# Patient Record
Sex: Female | Born: 1980 | Race: White | Hispanic: No | Marital: Married | Smoking: Never smoker
Health system: Southern US, Community
[De-identification: ages and names within clinical notes are randomized; demographics above are authoritative.]

## PROBLEM LIST (undated history)

## (undated) DIAGNOSIS — R319 Hematuria, unspecified: Secondary | ICD-10-CM

## (undated) DIAGNOSIS — F32A Depression, unspecified: Secondary | ICD-10-CM

## (undated) DIAGNOSIS — F329 Major depressive disorder, single episode, unspecified: Secondary | ICD-10-CM

## (undated) DIAGNOSIS — R519 Headache, unspecified: Secondary | ICD-10-CM

## (undated) DIAGNOSIS — F419 Anxiety disorder, unspecified: Secondary | ICD-10-CM

## (undated) DIAGNOSIS — N201 Calculus of ureter: Secondary | ICD-10-CM

## (undated) DIAGNOSIS — R3915 Urgency of urination: Secondary | ICD-10-CM

## (undated) DIAGNOSIS — R300 Dysuria: Secondary | ICD-10-CM

## (undated) DIAGNOSIS — R350 Frequency of micturition: Secondary | ICD-10-CM

## (undated) DIAGNOSIS — Z87442 Personal history of urinary calculi: Secondary | ICD-10-CM

## (undated) DIAGNOSIS — F909 Attention-deficit hyperactivity disorder, unspecified type: Secondary | ICD-10-CM

## (undated) DIAGNOSIS — R51 Headache: Secondary | ICD-10-CM

## (undated) HISTORY — DX: Anxiety disorder, unspecified: F41.9

---

## 2001-07-11 ENCOUNTER — Other Ambulatory Visit: Admission: RE | Admit: 2001-07-11 | Discharge: 2001-07-11 | Payer: Self-pay | Admitting: Gynecology

## 2002-02-26 ENCOUNTER — Inpatient Hospital Stay (HOSPITAL_COMMUNITY): Admission: AD | Admit: 2002-02-26 | Discharge: 2002-02-26 | Payer: Self-pay | Admitting: *Deleted

## 2002-07-03 ENCOUNTER — Encounter: Payer: Self-pay | Admitting: Obstetrics and Gynecology

## 2002-07-03 ENCOUNTER — Ambulatory Visit (HOSPITAL_COMMUNITY): Admission: RE | Admit: 2002-07-03 | Discharge: 2002-07-03 | Payer: Self-pay | Admitting: Obstetrics and Gynecology

## 2002-07-31 ENCOUNTER — Ambulatory Visit (HOSPITAL_COMMUNITY): Admission: RE | Admit: 2002-07-31 | Discharge: 2002-07-31 | Payer: Self-pay | Admitting: Obstetrics and Gynecology

## 2002-07-31 ENCOUNTER — Encounter: Payer: Self-pay | Admitting: Obstetrics and Gynecology

## 2002-11-25 ENCOUNTER — Inpatient Hospital Stay (HOSPITAL_COMMUNITY): Admission: AD | Admit: 2002-11-25 | Discharge: 2002-11-27 | Payer: Self-pay | Admitting: Obstetrics and Gynecology

## 2003-10-02 ENCOUNTER — Emergency Department (HOSPITAL_COMMUNITY): Admission: EM | Admit: 2003-10-02 | Discharge: 2003-10-02 | Payer: Self-pay | Admitting: Emergency Medicine

## 2004-03-16 ENCOUNTER — Emergency Department (HOSPITAL_COMMUNITY): Admission: EM | Admit: 2004-03-16 | Discharge: 2004-03-16 | Payer: Self-pay | Admitting: Emergency Medicine

## 2004-03-25 ENCOUNTER — Encounter: Admission: RE | Admit: 2004-03-25 | Discharge: 2004-03-25 | Payer: Self-pay | Admitting: Neurosurgery

## 2004-05-30 ENCOUNTER — Other Ambulatory Visit: Admission: RE | Admit: 2004-05-30 | Discharge: 2004-05-30 | Payer: Self-pay | Admitting: Gynecology

## 2004-11-07 ENCOUNTER — Ambulatory Visit (HOSPITAL_COMMUNITY): Admission: RE | Admit: 2004-11-07 | Discharge: 2004-11-07 | Payer: Self-pay | Admitting: *Deleted

## 2004-11-07 ENCOUNTER — Ambulatory Visit (HOSPITAL_BASED_OUTPATIENT_CLINIC_OR_DEPARTMENT_OTHER): Admission: RE | Admit: 2004-11-07 | Discharge: 2004-11-07 | Payer: Self-pay | Admitting: *Deleted

## 2004-11-07 HISTORY — PX: OTHER SURGICAL HISTORY: SHX169

## 2005-07-20 ENCOUNTER — Other Ambulatory Visit: Admission: RE | Admit: 2005-07-20 | Discharge: 2005-07-20 | Payer: Self-pay | Admitting: Gynecology

## 2006-08-26 ENCOUNTER — Other Ambulatory Visit: Admission: RE | Admit: 2006-08-26 | Discharge: 2006-08-26 | Payer: Self-pay | Admitting: Gynecology

## 2009-02-09 ENCOUNTER — Emergency Department (HOSPITAL_COMMUNITY): Admission: EM | Admit: 2009-02-09 | Discharge: 2009-02-09 | Payer: Self-pay | Admitting: Emergency Medicine

## 2009-02-09 ENCOUNTER — Ambulatory Visit: Payer: Self-pay | Admitting: Surgery

## 2009-02-09 ENCOUNTER — Encounter (INDEPENDENT_AMBULATORY_CARE_PROVIDER_SITE_OTHER): Payer: Self-pay | Admitting: Emergency Medicine

## 2009-05-11 ENCOUNTER — Emergency Department (HOSPITAL_COMMUNITY): Admission: EM | Admit: 2009-05-11 | Discharge: 2009-05-11 | Payer: Self-pay | Admitting: Emergency Medicine

## 2010-01-19 ENCOUNTER — Emergency Department (HOSPITAL_COMMUNITY): Admission: EM | Admit: 2010-01-19 | Discharge: 2010-01-19 | Payer: Self-pay | Admitting: Emergency Medicine

## 2010-01-25 ENCOUNTER — Emergency Department (HOSPITAL_COMMUNITY): Admission: EM | Admit: 2010-01-25 | Discharge: 2010-01-25 | Payer: Self-pay | Admitting: Family Medicine

## 2010-05-27 ENCOUNTER — Encounter (HOSPITAL_COMMUNITY): Payer: Self-pay | Admitting: Radiology

## 2010-05-27 ENCOUNTER — Inpatient Hospital Stay (HOSPITAL_COMMUNITY)
Admission: AD | Admit: 2010-05-27 | Discharge: 2010-05-27 | Disposition: A | Discharge status: Home / Self Care | Payer: Medicaid Other | Source: Ambulatory Visit | Attending: Obstetrics and Gynecology | Admitting: Obstetrics and Gynecology

## 2010-05-27 ENCOUNTER — Inpatient Hospital Stay (HOSPITAL_COMMUNITY): Admit: 2010-05-27 | Payer: Self-pay | Admitting: Obstetrics and Gynecology

## 2010-05-27 ENCOUNTER — Inpatient Hospital Stay (HOSPITAL_COMMUNITY): Payer: Medicaid Other

## 2010-05-27 DIAGNOSIS — R109 Unspecified abdominal pain: Secondary | ICD-10-CM

## 2010-05-27 DIAGNOSIS — R319 Hematuria, unspecified: Secondary | ICD-10-CM

## 2010-05-27 DIAGNOSIS — O99891 Other specified diseases and conditions complicating pregnancy: Secondary | ICD-10-CM

## 2010-05-27 DIAGNOSIS — O9989 Other specified diseases and conditions complicating pregnancy, childbirth and the puerperium: Secondary | ICD-10-CM

## 2010-05-27 LAB — URINALYSIS, ROUTINE W REFLEX MICROSCOPIC
Bilirubin Urine: NEGATIVE
Ketones, ur: 15 mg/dL — AB
Nitrite: NEGATIVE
Protein, ur: NEGATIVE mg/dL
Specific Gravity, Urine: >1.03 — ABNORMAL HIGH (ref 1.005–1.030)
Urine Glucose, Fasting: NEGATIVE mg/dL
Urobilinogen, UA: 0.2 mg/dL (ref 0.0–1.0)
pH: 6 (ref 5.0–8.0)

## 2010-05-27 LAB — URINE MICROSCOPIC-ADD ON

## 2010-05-29 LAB — URINE CULTURE
Colony Count: NO GROWTH
Culture: NO GROWTH

## 2010-08-26 ENCOUNTER — Other Ambulatory Visit (HOSPITAL_COMMUNITY): Payer: Self-pay | Admitting: Obstetrics and Gynecology

## 2010-08-26 DIAGNOSIS — O3660X0 Maternal care for excessive fetal growth, unspecified trimester, not applicable or unspecified: Secondary | ICD-10-CM

## 2010-08-28 ENCOUNTER — Ambulatory Visit (HOSPITAL_COMMUNITY)
Admission: RE | Admit: 2010-08-28 | Discharge: 2010-08-28 | Disposition: A | Discharge status: Home / Self Care | Payer: Medicaid Other | Source: Ambulatory Visit | Attending: Obstetrics and Gynecology | Admitting: Obstetrics and Gynecology

## 2010-08-28 DIAGNOSIS — Z1389 Encounter for screening for other disorder: Secondary | ICD-10-CM | POA: Insufficient documentation

## 2010-08-28 DIAGNOSIS — O3660X0 Maternal care for excessive fetal growth, unspecified trimester, not applicable or unspecified: Secondary | ICD-10-CM

## 2010-08-28 DIAGNOSIS — Z363 Encounter for antenatal screening for malformations: Secondary | ICD-10-CM | POA: Insufficient documentation

## 2010-08-29 NOTE — Discharge Summary (Signed)
NAME:  Julie Mills, Julie Mills                       ACCOUNT NO.:  [id number]   MEDICAL RECORD NO.:  [id number]                   PATIENT TYPE:  INP   LOCATION:  9142                                 FACILITY:  [facility]   PHYSICIAN:  Zenaida Niece, M.D.             DATE OF BIRTH:  [birth year]   DATE OF ADMISSION:  11/25/2002  DATE OF DISCHARGE:  11/27/2002                                 DISCHARGE SUMMARY   ADMISSION DIAGNOSES:  1. Intrauterine pregnancy at 38 weeks.  2. Group B Strep carrier.   DISCHARGE DIAGNOSES:  1. Intrauterine pregnancy at 38 weeks.  2. Group B Strep carrier.   PROCEDURE:  On November 25, 2002, she had a spontaneous vaginal delivery and  repair of a fourth degree laceration.   HISTORY OF PRESENT ILLNESS:  This is a 30 year old white female gravida 2,  para 0-0-1-0 with an EGA of 38+ weeks, who presented with a complaint of  ruptured membranes at 0900 on the day of admission.  Initial evaluation in  triage revealed her to be ruptured and 5 cm dilated.  Prenatal care  complicated only by group B Strep positive.   PRENATAL LABORATORY DATA:  O positive with a negative antibody screen.  RPR  nonreactive.  Rubella immune.  Hepatitis B surface antigen negative.  Gonorrhea and Chlamydia negative.  Triple screen normal.  One hour Glucola  113 and group B Strep is positive.   PAST MEDICAL HISTORY:  Mild asthma.   CURRENT MEDICATIONS:  Albuterol inhaler as needed.   PHYSICAL EXAMINATION:  VITAL SIGNS:  She is afebrile with stable vital  signs.  Fetal heart tracing reactive with variable decelerations.  ABDOMEN:  Gravid, nontender.  VAGINAL:  On Dr. Berenda Morale first examination, she is complete, complete  and +3.   HOSPITAL COURSE:  The patient was admitted and progressed on her own and  received one dose of antibiotics for group B Strep positive.  She had a  vaginal delivery of a viable female infant with Apgars of 9 and 9 that weighed  7 pounds 14 ounces. Placenta  delivered spontaneous and was intact.  There  was a nuchal cord x1 which was reduced.  She had a fourth degree laceration  repaired in standard fashion.  Estimated blood loss was 400 mL.   Postpartum, the patient did well without complications and breast fed her  baby without problems.  Predelivery hemoglobin was 14.7, post delivery was  12.3.   On postpartum day #2, she is stable for discharge home.   DISCHARGE INSTRUCTIONS:  Regular diet.  Pelvic rest.  Follow-up in six  weeks.   DISCHARGE MEDICATIONS:  1. Percocet #20 one to two p.o. q.4-6h. p.r.n. pain.  2. Over-the-counter Motrin.  3. Over-the-counter stool softeners.   She is also given our discharge pamphlet.  Zenaida Niece, M.D.    TDM/MEDQ  D:  11/27/2002  T:  11/27/2002  Job:  [id number]
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
## 2010-09-15 ENCOUNTER — Inpatient Hospital Stay (HOSPITAL_COMMUNITY)
Admission: AD | Admit: 2010-09-15 | Discharge: 2010-09-17 | DRG: 775 | Disposition: A | Discharge status: Home / Self Care | Payer: Medicaid Other | Source: Ambulatory Visit | Attending: Obstetrics and Gynecology | Admitting: Obstetrics and Gynecology

## 2010-09-15 LAB — RPR: RPR Ser Ql: NONREACTIVE

## 2010-09-15 LAB — CBC
HCT: 38 % (ref 36.0–46.0)
Hemoglobin: 13.5 g/dL (ref 12.0–15.0)
MCH: 32.7 pg (ref 26.0–34.0)
MCHC: 35.5 g/dL (ref 30.0–36.0)
MCV: 92 fL (ref 78.0–100.0)
RBC: 4.13 MIL/uL (ref 3.87–5.11)
RDW: 13.2 % (ref 11.5–15.5)
WBC: 8.9 10*3/uL (ref 4.0–10.5)

## 2010-09-16 LAB — CBC
HCT: 31.4 % — ABNORMAL LOW (ref 36.0–46.0)
Hemoglobin: 10.4 g/dL — ABNORMAL LOW (ref 12.0–15.0)
MCH: 31 pg (ref 26.0–34.0)
MCHC: 33.1 g/dL (ref 30.0–36.0)
MCV: 93.7 fL (ref 78.0–100.0)
Platelets: 162 10*3/uL (ref 150–400)
RBC: 3.35 MIL/uL — ABNORMAL LOW (ref 3.87–5.11)
RDW: 13.5 % (ref 11.5–15.5)
WBC: 8.7 10*3/uL (ref 4.0–10.5)

## 2010-09-16 LAB — CCBB MATERNAL DONOR DRAW

## 2010-09-19 ENCOUNTER — Inpatient Hospital Stay (HOSPITAL_COMMUNITY)
Admission: AD | Admit: 2010-09-19 | Payer: Medicaid Other | Source: Ambulatory Visit | Admitting: Obstetrics and Gynecology

## 2010-09-19 NOTE — Discharge Summary (Signed)
  Julie Mills, BURD NO.:  [id number]  MEDICAL RECORD NO.:  [id number]  LOCATION:  9111                          FACILITY:  [facility]  PHYSICIAN:  Huel Cote, M.D. DATE OF BIRTH:  1980-08-04  DATE OF ADMISSION:  09/15/2010 DATE OF DISCHARGE:  09/17/2010                              DISCHARGE SUMMARY   DISCHARGE DIAGNOSES: 1. Term pregnancy at 39 plus weeks delivered. 2. Status post normal spontaneous vaginal delivery.  DISCHARGE MEDICATIONS: 1. Motrin 600 mg p.o. every 6 hours. 2. Percocet 1-2 tablets p.o. every 4 hours p.r.n.  DISCHARGE FOLLOWUP:  The patient to follow up in 6 weeks for her full postpartum exam.  HOSPITAL COURSE:  The patient is a 30 year old G3, P1-0-1-1 who was admitted at 8 plus weeks' gestation with spontaneous onset of regular contractions and cervical change to 3-4 cm.  She was admitted and continued to progress on her own.  Her prenatal care was relatively uneventful except for size greater than dates with ultrasound performed approximately 1 month prior to admission showing baby at the 75th percentile.  Prenatal labs are as follows:  O+, rubella immune, hepatitis B surface antigen negative, first trimester screen normal, 1- hour Glucola was 162, 3-hour Glucola was normal, group B strep was negative.  PAST OBSTETRICAL HISTORY:  She had a vaginal delivery at 38 weeks of a 7 pounds 14 ounces infant in 2004.  PAST MEDICAL HISTORY:  Significant for asthma, migraines, and depression, although she is on no medications currently.  PAST SURGICAL HISTORY:  Breast augmentation.  ALLERGIES:  There were no known drug allergies.  MEDICATIONS:  None.  On admission, she was afebrile with stable vital signs.  Fetal heart rate was reactive.  After laboring for several hours she progressed on her own to complete dilation and had rupture of membranes performed shortly before that with moderate meconium noted.  She then progressed to  complete and pushed well with a normal spontaneous vaginal delivery of a viable female infant over small second-degree laceration.  Apgars were 08/09, weight was 9 pounds 4 ounces.  Placenta delivered spontaneously.  A second-degree laceration was repaired with 3-0 Vicryl Rapide.  There was a right labial a periurethral laceration which was also repaired for hemostasis with 3-0 Vicryl as well.  Estimated blood loss 400 mL.  She was then admitted for routine postpartum care.  On postpartum day #1, she was doing quite well.  Hemoglobin was 10.4.  Pain was well-controlled with p.o. medications.  On postpartum day #2, she was given her prescriptions for pain meds and advised on pelvic rest and will make her appointment for her 6-week checkup.  She was then discharged to home.     Huel Cote, M.D.     KR/MEDQ  D:  09/17/2010  T:  09/18/2010  Job:  [id number]  Electronically Signed by Huel Cote M.D. on 09/19/2010 11:00:07 PM

## 2010-10-16 ENCOUNTER — Emergency Department (HOSPITAL_COMMUNITY): Payer: Medicaid Other

## 2010-10-16 ENCOUNTER — Emergency Department (HOSPITAL_COMMUNITY)
Admission: EM | Admit: 2010-10-16 | Discharge: 2010-10-17 | Disposition: A | Discharge status: Home / Self Care | Payer: Medicaid Other | Attending: Emergency Medicine | Admitting: Emergency Medicine

## 2010-10-16 DIAGNOSIS — R109 Unspecified abdominal pain: Secondary | ICD-10-CM | POA: Insufficient documentation

## 2010-10-16 DIAGNOSIS — Z79899 Other long term (current) drug therapy: Secondary | ICD-10-CM | POA: Insufficient documentation

## 2010-10-16 DIAGNOSIS — R300 Dysuria: Secondary | ICD-10-CM | POA: Insufficient documentation

## 2010-10-16 DIAGNOSIS — N200 Calculus of kidney: Secondary | ICD-10-CM | POA: Insufficient documentation

## 2010-10-16 DIAGNOSIS — N390 Urinary tract infection, site not specified: Secondary | ICD-10-CM | POA: Insufficient documentation

## 2010-10-16 LAB — URINALYSIS, ROUTINE W REFLEX MICROSCOPIC
Glucose, UA: NEGATIVE mg/dL
Ketones, ur: 15 mg/dL — AB
Nitrite: POSITIVE — AB
Protein, ur: 30 mg/dL — AB
Specific Gravity, Urine: 1.022 (ref 1.005–1.030)
Urobilinogen, UA: 1 mg/dL (ref 0.0–1.0)

## 2010-10-16 LAB — CBC
HCT: 37.6 % (ref 36.0–46.0)
Hemoglobin: 13 g/dL (ref 12.0–15.0)
MCH: 31.2 pg (ref 26.0–34.0)
MCHC: 34.6 g/dL (ref 30.0–36.0)
MCV: 90.2 fL (ref 78.0–100.0)
Platelets: 203 10*3/uL (ref 150–400)
RBC: 4.17 MIL/uL (ref 3.87–5.11)
RDW: 12 % (ref 11.5–15.5)
WBC: 7.7 10*3/uL (ref 4.0–10.5)

## 2010-10-16 LAB — URINE MICROSCOPIC-ADD ON

## 2010-10-16 LAB — POCT I-STAT, CHEM 8
BUN: 20 mg/dL (ref 6–23)
Calcium, Ion: 1.2 mmol/L (ref 1.12–1.32)
Chloride: 107 mEq/L (ref 96–112)
Creatinine, Ser: 1 mg/dL (ref 0.50–1.10)
Glucose, Bld: 130 mg/dL — ABNORMAL HIGH (ref 70–99)
HCT: 39 % (ref 36.0–46.0)
Hemoglobin: 13.3 g/dL (ref 12.0–15.0)
Sodium: 141 mEq/L (ref 135–145)

## 2010-10-16 LAB — DIFFERENTIAL
Basophils Absolute: 0 10*3/uL (ref 0.0–0.1)
Eosinophils Absolute: 0.1 10*3/uL (ref 0.0–0.7)
Eosinophils Relative: 2 % (ref 0–5)
Lymphs Abs: 1.8 10*3/uL (ref 0.7–4.0)
Monocytes Absolute: 0.5 10*3/uL (ref 0.1–1.0)
Monocytes Relative: 6 % (ref 3–12)

## 2010-10-16 LAB — POCT PREGNANCY, URINE: Preg Test, Ur: NEGATIVE

## 2010-10-18 LAB — URINE CULTURE
Colony Count: 50000
Culture  Setup Time: 201207060653

## 2013-03-03 ENCOUNTER — Ambulatory Visit: Payer: Self-pay

## 2013-11-03 ENCOUNTER — Encounter (HOSPITAL_COMMUNITY): Payer: Self-pay | Admitting: Emergency Medicine

## 2013-11-03 ENCOUNTER — Emergency Department (INDEPENDENT_AMBULATORY_CARE_PROVIDER_SITE_OTHER)
Admission: EM | Admit: 2013-11-03 | Discharge: 2013-11-03 | Disposition: A | Discharge status: Home / Self Care | Payer: Self-pay | Source: Home / Self Care | Attending: Family Medicine | Admitting: Family Medicine

## 2013-11-03 DIAGNOSIS — M79609 Pain in unspecified limb: Secondary | ICD-10-CM

## 2013-11-03 DIAGNOSIS — M79604 Pain in right leg: Secondary | ICD-10-CM

## 2013-11-03 MED ORDER — METHYLPREDNISOLONE 4 MG PO KIT: PACK | ORAL | Status: DC

## 2013-11-03 NOTE — ED Notes (Signed)
C/o right leg pain since Sunday.  States pain is an achy sensation in the entire leg.  Denies any known injury.   Also states had episodes of nausea and dizziness since pain started in leg.  No relief with otc meds.

## 2013-11-03 NOTE — ED Provider Notes (Signed)
CSN: [id number]     Arrival date & time 11/03/13  1143 History   First MD Initiated Contact with Patient 11/03/13 1226     Chief Complaint  Patient presents with  . Leg Pain   (Consider location/radiation/quality/duration/timing/severity/associated sxs/prior Treatment) Patient is a 33 y.o. female presenting with leg pain. The history is provided by the patient.  Leg Pain Location:  Leg Time since incident:  5 days Injury: no   Leg location:  R lower leg and R upper leg Pain details:    Quality:  Aching   Radiates to:  Does not radiate   Severity:  Moderate   Onset quality:  Sudden   Progression:  Unchanged Chronicity:  New Dislocation: no   Foreign body present:  No foreign bodies Prior injury to area:  No Relieved by:  Nothing Associated symptoms: no back pain, no decreased ROM, no fever, no numbness, no stiffness and no swelling   Risk factors: no frequent fractures and no known bone disorder   Risk factors comment:  Remote h/o dvt in leg., NKI, no new activity or shoes or sports.   Past Medical History  Diagnosis Date  . Anxiety    History reviewed. No pertinent past surgical history. Family History  Problem Relation Age of Onset  . Hyperlipidemia Father   . Cancer Maternal Grandmother   . Diabetes Maternal Grandmother   . Heart disease Maternal Grandmother   . Cancer Maternal Grandfather   . Diabetes Maternal Grandfather   . Heart disease Maternal Grandfather   . Stroke Maternal Grandfather    History  Substance Use Topics  . Smoking status: Never Smoker   . Smokeless tobacco: Not on file  . Alcohol Use: No   OB History   Grav Para Term Preterm Abortions TAB SAB Ect Mult Living   1              Review of Systems  Constitutional: Negative.  Negative for fever.  Musculoskeletal: Negative for back pain, gait problem, joint swelling and stiffness.  Skin: Negative.     Allergies  Review of patient's allergies indicates no known allergies.  Home  Medications   Prior to Admission medications   Medication Sig Start Date End Date Taking? Authorizing Provider  buPROPion (WELLBUTRIN SR) 150 MG 12 hr tablet Take 150 mg by mouth daily.   Yes Historical Provider, MD  FLUoxetine HCl (PROZAC PO) Take by mouth.   Yes Historical Provider, MD  levonorgestrel-ethinyl estradiol (SEASONALE,INTROVALE,JOLESSA) 0.15-0.03 MG tablet Take 1 tablet by mouth daily.   Yes Historical Provider, MD  methylPREDNISolone (MEDROL DOSEPAK) 4 MG tablet follow package directions, start today, take until finished. 11/03/13   Linna HoffJames D Mada Sadik, MD   BP 125/84  Pulse 80  Temp(Src) 98.1 F (36.7 C) (Oral)  Resp 12  SpO2 100%  LMP 10/04/2013 Physical Exam  Nursing note and vitals reviewed. Constitutional: She is oriented to person, place, and time. She appears well-developed and well-nourished.  Musculoskeletal: She exhibits tenderness.  Neg slr, reflexes nl, pulses 2+, no back pain , full hip rom, no edema or leg erythema. Ambulatory.  Neurological: She is alert and oriented to person, place, and time.  Skin: Skin is warm and dry.    ED Course  Procedures (including critical care time) Labs Review Labs Reviewed - No data to display  Imaging Review No results found.   MDM   1. Lower extremity pain, diffuse, right        Linna HoffJames D Kalene Cutler, MD  11/03/13 1334 

## 2013-11-03 NOTE — Discharge Instructions (Signed)
As discussed, see orthopedist if problem continues.

## 2013-11-03 NOTE — ED Notes (Signed)
Per pt request Rx for Medrol DosePak 4mg   Called to [facility] [address] [place].   Mw,cma

## 2014-02-12 ENCOUNTER — Encounter (HOSPITAL_COMMUNITY): Payer: Self-pay | Admitting: Emergency Medicine

## 2014-04-13 HISTORY — PX: BREAST ENHANCEMENT SURGERY: SHX7

## 2015-09-12 DIAGNOSIS — D509 Iron deficiency anemia, unspecified: Secondary | ICD-10-CM | POA: Diagnosis not present

## 2015-11-14 DIAGNOSIS — F900 Attention-deficit hyperactivity disorder, predominantly inattentive type: Secondary | ICD-10-CM | POA: Diagnosis not present

## 2015-11-14 DIAGNOSIS — F422 Mixed obsessional thoughts and acts: Secondary | ICD-10-CM | POA: Diagnosis not present

## 2016-01-26 ENCOUNTER — Encounter (HOSPITAL_COMMUNITY): Payer: Self-pay | Admitting: Family Medicine

## 2016-01-26 ENCOUNTER — Ambulatory Visit (HOSPITAL_COMMUNITY)
Admission: EM | Admit: 2016-01-26 | Discharge: 2016-01-26 | Disposition: A | Discharge status: Home / Self Care | Payer: BLUE CROSS/BLUE SHIELD | Attending: Internal Medicine | Admitting: Internal Medicine

## 2016-01-26 DIAGNOSIS — R519 Headache, unspecified: Secondary | ICD-10-CM

## 2016-01-26 DIAGNOSIS — R51 Headache: Secondary | ICD-10-CM

## 2016-01-26 NOTE — ED Triage Notes (Signed)
Pt here for left sided HA and throbbing. sts x 2 weeks and also neck pain and popping.

## 2016-01-26 NOTE — Discharge Instructions (Signed)
You should see your doctor tomorrow. If you need further tests your doctor will be able to arrange those tests for you.  Continue symptomatic treatment at home.

## 2016-01-27 DIAGNOSIS — Z3041 Encounter for surveillance of contraceptive pills: Secondary | ICD-10-CM | POA: Diagnosis not present

## 2016-01-27 DIAGNOSIS — Z681 Body mass index (BMI) 19 or less, adult: Secondary | ICD-10-CM | POA: Diagnosis not present

## 2016-01-27 DIAGNOSIS — Z1389 Encounter for screening for other disorder: Secondary | ICD-10-CM | POA: Diagnosis not present

## 2016-01-27 DIAGNOSIS — Z01419 Encounter for gynecological examination (general) (routine) without abnormal findings: Secondary | ICD-10-CM | POA: Diagnosis not present

## 2016-01-27 NOTE — ED Provider Notes (Signed)
CSN: [id number]     Arrival date & time 01/26/16  1549 History   First MD Initiated Contact with Patient 01/26/16 1735     Chief Complaint  Patient presents with  . Headache  . Neck Pain   (Consider location/radiation/quality/duration/timing/severity/associated sxs/prior Treatment) HPI Pt states that she has had a headache tha is the worse she has ever had for the last 2 weeks. Feeling as if something is popping in her neck almost causes her to pass out. No relief with home treatment. No hx of migraines some nausea and blurred vision.  Past Medical History:  Diagnosis Date  . Anxiety    History reviewed. No pertinent surgical history. Family History  Problem Relation Age of Onset  . Hyperlipidemia Father   . Cancer Maternal Grandmother   . Diabetes Maternal Grandmother   . Heart disease Maternal Grandmother   . Cancer Maternal Grandfather   . Diabetes Maternal Grandfather   . Heart disease Maternal Grandfather   . Stroke Maternal Grandfather    Social History  Substance Use Topics  . Smoking status: Never Smoker  . Smokeless tobacco: Never Used  . Alcohol use No   OB History    Gravida Para Term Preterm AB Living   1             SAB TAB Ectopic Multiple Live Births                 Review of Systems  Denies:   ABDOMINAL PAIN, CHEST PAIN, CONGESTION, DYSURIA, SHORTNESS OF BREATH  Allergies  Review of patient's allergies indicates no known allergies.  Home Medications   Prior to Admission medications   Medication Sig Start Date End Date Taking? Authorizing Provider  buPROPion (WELLBUTRIN SR) 150 MG 12 hr tablet Take 150 mg by mouth daily.    Historical Provider, MD  FLUoxetine HCl (PROZAC PO) Take by mouth.    Historical Provider, MD  levonorgestrel-ethinyl estradiol (SEASONALE,INTROVALE,JOLESSA) 0.15-0.03 MG tablet Take 1 tablet by mouth daily.    Historical Provider, MD  methylPREDNISolone (MEDROL DOSEPAK) 4 MG tablet follow package directions, start today, take  until finished. 11/03/13   Linna Hoff, MD   Meds Ordered and Administered this Visit  Medications - No data to display  BP (!) 135/101   Pulse 85   Temp 98.6 F (37 C)   Resp 18   SpO2 100%  No data found.   Physical Exam NURSES NOTES AND VITAL SIGNS REVIEWED. CONSTITUTIONAL: Well developed, well nourished, no acute distress HEENT: normocephalic, atraumatic EYES: Conjunctiva normal NECK:normal ROM, supple, no adenopathy PULMONARY:No respiratory distress, normal effort ABDOMINAL: Soft, ND, NT BS+, No CVAT MUSCULOSKELETAL: Normal ROM of all extremities,  SKIN: warm and dry without rash PSYCHIATRIC: Mood and affect, behavior are normal NEUROLOGICAL SCREENING EXAM: Constitutional:  oriented to person, place, and time.  Neurological:  .  normal strength and normal reflexes. No cranial nerve deficit or sensory deficit. negative Romberg sign. GCS eye subscore is 4. GCS verbal subscore is 5. GCS motor subscore is 6.    Urgent Care Course   Clinical Course    Procedures (including critical care time)  Labs Review Labs Reviewed - No data to display  Imaging Review No results found.   Visual Acuity Review  Right Eye Distance:   Left Eye Distance:   Bilateral Distance:    Right Eye Near:   Left Eye Near:    Bilateral Near:         MDM  1. Nonintractable headache, unspecified chronicity pattern, unspecified headache type    Pt is advised to go to ER or see her PCP for CT scan and further workup.    Tharon AquasFrank C Patrick, PA 01/27/16 1422

## 2016-03-30 DIAGNOSIS — F900 Attention-deficit hyperactivity disorder, predominantly inattentive type: Secondary | ICD-10-CM | POA: Diagnosis not present

## 2016-03-30 DIAGNOSIS — F422 Mixed obsessional thoughts and acts: Secondary | ICD-10-CM | POA: Diagnosis not present

## 2016-05-25 DIAGNOSIS — R319 Hematuria, unspecified: Secondary | ICD-10-CM | POA: Diagnosis not present

## 2016-09-21 DIAGNOSIS — F422 Mixed obsessional thoughts and acts: Secondary | ICD-10-CM | POA: Diagnosis not present

## 2016-09-21 DIAGNOSIS — F900 Attention-deficit hyperactivity disorder, predominantly inattentive type: Secondary | ICD-10-CM | POA: Diagnosis not present

## 2017-01-21 ENCOUNTER — Emergency Department (HOSPITAL_COMMUNITY)
Admission: EM | Admit: 2017-01-21 | Discharge: 2017-01-21 | Disposition: A | Discharge status: Home / Self Care | Payer: BLUE CROSS/BLUE SHIELD | Attending: Emergency Medicine | Admitting: Emergency Medicine

## 2017-01-21 ENCOUNTER — Encounter (HOSPITAL_COMMUNITY): Payer: Self-pay | Admitting: Emergency Medicine

## 2017-01-21 DIAGNOSIS — Z79899 Other long term (current) drug therapy: Secondary | ICD-10-CM | POA: Insufficient documentation

## 2017-01-21 DIAGNOSIS — Z87442 Personal history of urinary calculi: Secondary | ICD-10-CM | POA: Diagnosis not present

## 2017-01-21 DIAGNOSIS — R109 Unspecified abdominal pain: Secondary | ICD-10-CM | POA: Diagnosis not present

## 2017-01-21 DIAGNOSIS — N200 Calculus of kidney: Secondary | ICD-10-CM | POA: Diagnosis not present

## 2017-01-21 LAB — URINALYSIS, ROUTINE W REFLEX MICROSCOPIC
Bilirubin Urine: NEGATIVE
GLUCOSE, UA: NEGATIVE mg/dL
Ketones, ur: NEGATIVE mg/dL
Nitrite: NEGATIVE
PH: 6 (ref 5.0–8.0)
Protein, ur: 30 mg/dL — AB
SPECIFIC GRAVITY, URINE: 1.017 (ref 1.005–1.030)

## 2017-01-21 LAB — CBC
HEMATOCRIT: 40 % (ref 36.0–46.0)
HEMOGLOBIN: 13.4 g/dL (ref 12.0–15.0)
MCH: 31.3 pg (ref 26.0–34.0)
MCHC: 33.5 g/dL (ref 30.0–36.0)
MCV: 93.5 fL (ref 78.0–100.0)
Platelets: 221 10*3/uL (ref 150–400)
RBC: 4.28 MIL/uL (ref 3.87–5.11)
RDW: 12.8 % (ref 11.5–15.5)
WBC: 4.7 10*3/uL (ref 4.0–10.5)

## 2017-01-21 LAB — BASIC METABOLIC PANEL
Anion gap: 8 (ref 5–15)
BUN: 15 mg/dL (ref 6–20)
CHLORIDE: 107 mmol/L (ref 101–111)
CO2: 24 mmol/L (ref 22–32)
Calcium: 8.6 mg/dL — ABNORMAL LOW (ref 8.9–10.3)
Creatinine, Ser: 0.87 mg/dL (ref 0.44–1.00)
GFR calc Af Amer: >60 mL/min (ref >60)
GFR calc non Af Amer: >60 mL/min (ref >60)
GLUCOSE: 119 mg/dL — AB (ref 65–99)
Potassium: 3.5 mmol/L (ref 3.5–5.1)
SODIUM: 139 mmol/L (ref 135–145)

## 2017-01-21 LAB — PREGNANCY, URINE: PREG TEST UR: NEGATIVE

## 2017-01-21 MED ORDER — ONDANSETRON 4 MG PO TBDP
4.0000 mg | ORAL_TABLET | Freq: Once | ORAL | Status: AC
  Administered 2017-01-21: 4 mg via ORAL
  Filled 2017-01-21: qty 1

## 2017-01-21 MED ORDER — ONDANSETRON HCL 4 MG PO TABS: 4.0000 mg | ORAL_TABLET | Freq: Once | ORAL | Status: DC

## 2017-01-21 MED ORDER — HYDROCODONE-ACETAMINOPHEN 5-325 MG PO TABS: 1.0000 | ORAL_TABLET | Freq: Four times a day (QID) | ORAL | 0 refills | Status: DC | PRN

## 2017-01-21 MED ORDER — HYDROMORPHONE HCL 1 MG/ML IJ SOLN
1.0000 mg | Freq: Once | INTRAMUSCULAR | Status: AC
  Administered 2017-01-21: 1 mg via INTRAVENOUS
  Filled 2017-01-21: qty 1

## 2017-01-21 MED ORDER — HYDROCODONE-ACETAMINOPHEN 5-325 MG PO TABS
1.0000 | ORAL_TABLET | Freq: Once | ORAL | Status: AC
  Administered 2017-01-21: 1 via ORAL
  Filled 2017-01-21: qty 1

## 2017-01-21 MED ORDER — ONDANSETRON HCL 4 MG PO TABS: 4.0000 mg | ORAL_TABLET | Freq: Three times a day (TID) | ORAL | 0 refills | Status: DC | PRN

## 2017-01-21 MED ORDER — KETOROLAC TROMETHAMINE 30 MG/ML IJ SOLN
30.0000 mg | Freq: Once | INTRAMUSCULAR | Status: AC
  Administered 2017-01-21: 30 mg via INTRAVENOUS
  Filled 2017-01-21: qty 1

## 2017-01-21 MED ORDER — TAMSULOSIN HCL 0.4 MG PO CAPS
0.4000 mg | ORAL_CAPSULE | Freq: Once | ORAL | Status: AC
  Administered 2017-01-21: 0.4 mg via ORAL
  Filled 2017-01-21: qty 1

## 2017-01-21 MED ORDER — TAMSULOSIN HCL 0.4 MG PO CAPS: 0.4000 mg | ORAL_CAPSULE | Freq: Every day | ORAL | 0 refills | Status: DC

## 2017-01-21 NOTE — ED Notes (Signed)
Pt states she cannot give urine sample at this time.

## 2017-01-21 NOTE — ED Triage Notes (Signed)
Pt complaint of left flank pain with associated n/v, dysuria, and hematuria; hx of kidney stones; onset Tuesday.

## 2017-01-21 NOTE — ED Notes (Signed)
ED Provider at bedside. 

## 2017-01-21 NOTE — Discharge Instructions (Signed)
You were seen at [facility] for pain in your back and in your abdomen. This pain is very likely due to a kidney stone coming from your left kidney. We gave you a prescription for Norco to help with the pain. Please take 1 tablet every 6 hours as needed for pain. We also gave a prescription for Zofran to help with the nausea. You can take 1 tablet every 8 hours as needed. Flomax will help manage your symptoms as well. Please take 1 tablet daily starting tomorrow (10/12) for 4 days. If your pain continues to worsen despite taking the pain medicine or if you notice you are making less urine than usual return to the emergency department for further evaluation and treatment.

## 2017-01-21 NOTE — ED Provider Notes (Signed)
[facility]-EMERGENCY DEPT Provider Note   CSN: [id number] Arrival date & time: 01/21/17  7829    History   Chief Complaint Chief Complaint  Patient presents with  . Flank Pain    HPI Julie Mills is a 36 y.o. female with history of anxiety and chronic kidney stones who presents to the ED with L flank pain. Patient states pain started 2 days ago and has progressively worsened. It has now traveled down to her LLQ. It is a 8/10. She reports pain with urination and gross hematuria. She has also noticed increased urinary frequency and decreased urine volume. Denies clots and burning with urination. Denies fever, chills, and N/V. LMP 2.5 weeks ago. She has a history of chronic kidney stones with last one being ~4 years ago. Has not needed interventions for kidney stones in the past.   HPI  Past Medical History:  Diagnosis Date  . Anxiety     There are no active problems to display for this patient.   History reviewed. No pertinent surgical history.  OB History    Gravida Para Term Preterm AB Living   1             SAB TAB Ectopic Multiple Live Births                   Home Medications    Prior to Admission medications   Medication Sig Start Date End Date Taking? Authorizing Provider  ADDERALL XR 30 MG 24 hr capsule Take 30 mg by mouth daily as needed (for ADHD).  12/30/16  Yes [provider]  ALPRAZolam Prudy Feeler) 0.5 MG tablet Take 0.5 mg by mouth 3 (three) times daily as needed for anxiety. 01/16/17  Yes [provider]  buPROPion (WELLBUTRIN XL) 150 MG 24 hr tablet Take 150 mg by mouth daily.   Yes [provider]  FLUoxetine (PROZAC) 40 MG capsule Take 40 mg by mouth daily.   Yes [provider]  ibuprofen (ADVIL,MOTRIN) 200 MG tablet Take 400 mg by mouth every 6 (six) hours as needed for fever, headache, mild pain, moderate pain or cramping.   Yes [provider]  Levonorgestrel-Ethinyl Estradiol (SEASONIQUE) 0.15-0.03 &0.01 MG  tablet Take 1 tablet by mouth daily.   Yes [provider]  HYDROcodone-acetaminophen (NORCO/VICODIN) 5-325 MG tablet Take 1 tablet by mouth every 6 (six) hours as needed for moderate pain. 01/21/17 01/21/18  Santos-Sanchez, Chelsea Primus, MD  ondansetron (ZOFRAN) 4 MG tablet Take 1 tablet (4 mg total) by mouth every 8 (eight) hours as needed for nausea or vomiting. 01/21/17 01/21/18  Santos-Sanchez, Chelsea Primus, MD  tamsulosin (FLOMAX) 0.4 MG CAPS capsule Take 1 capsule (0.4 mg total) by mouth daily. Start taking on 10/12. 01/21/17   Burna Cash, MD    Family History Family History  Problem Relation Age of Onset  . Hyperlipidemia Father   . Cancer Maternal Grandmother   . Diabetes Maternal Grandmother   . Heart disease Maternal Grandmother   . Cancer Maternal Grandfather   . Diabetes Maternal Grandfather   . Heart disease Maternal Grandfather   . Stroke Maternal Grandfather     Social History Social History  Substance Use Topics  . Smoking status: Never Smoker  . Smokeless tobacco: Never Used  . Alcohol use No     Allergies   Patient has no known allergies.   Review of Systems Review of Systems  Constitutional: Positive for appetite change. Negative for chills and fever.  Gastrointestinal: Positive for abdominal  pain. Negative for constipation, diarrhea, nausea and vomiting.  Genitourinary: Positive for dysuria, flank pain, frequency, hematuria and urgency.  All other systems reviewed and are negative.    Physical Exam Updated Vital Signs BP (!) 153/98 (BP Location: Right Arm)   Pulse 99   Temp 98.5 F (36.9 C) (Oral)   Resp 18   LMP 01/05/2017   SpO2 99%   Physical Exam  Constitutional: She appears well-developed and well-nourished.  Appears uncomfortable secondary to pain, but in no acute distress   Cardiovascular: Normal rate, regular rhythm and normal heart sounds.  Exam reveals no gallop and no friction rub.   No murmur heard. Pulmonary/Chest:  Effort normal and breath sounds normal.  Abd: soft, NTND, normoactive bowel sounds. There is L CVA tenderness.    ED Treatments / Results  Labs (all labs ordered are listed, but only abnormal results are displayed) Labs Reviewed  URINALYSIS, ROUTINE W REFLEX MICROSCOPIC - Abnormal; Notable for the following:       Result Value   APPearance HAZY (*)    Hgb urine dipstick LARGE (*)    Protein, ur 30 (*)    Leukocytes, UA TRACE (*)    Bacteria, UA RARE (*)    Squamous Epithelial / LPF 0-5 (*)    All other components within normal limits  BASIC METABOLIC PANEL - Abnormal; Notable for the following:    Glucose, Bld 119 (*)    Calcium 8.6 (*)    All other components within normal limits  CBC  PREGNANCY, URINE    EKG  EKG Interpretation None       Radiology No results found.  Procedures Procedures (including critical care time)  Medications Ordered in ED Medications  HYDROmorphone (DILAUDID) injection 1 mg (1 mg Intravenous Given 01/21/17 0738)  ketorolac (TORADOL) 30 MG/ML injection 30 mg (30 mg Intravenous Given 01/21/17 0738)  HYDROcodone-acetaminophen (NORCO/VICODIN) 5-325 MG per tablet 1 tablet (1 tablet Oral Given 01/21/17 1044)  tamsulosin (FLOMAX) capsule 0.4 mg (0.4 mg Oral Given 01/21/17 1044)  ondansetron (ZOFRAN-ODT) disintegrating tablet 4 mg (4 mg Oral Given 01/21/17 1044)     Initial Impression / Assessment and Plan / ED Course  I have reviewed the triage vital signs and the nursing notes.  Pertinent labs & imaging results that were available during my care of the patient were reviewed by me and considered in my medical decision making (see chart for details).    Julie Mills is a 36 y.o. female with history of anxiety and chronic kidney stones who presents to the ED with L flank pain, dysuria, and increased urinary frequency concerning for nephrolithiasis given prior history. On exam, she seems moderately uncomfortable and has L CVA tenderness. She  is afebrile and hemodynamically stable. DDx also includes pyelonephritis, UTI. Will order CBC, BMP, and UA. IV Dilaudid 1 mg and Toradol 30 x1 given. Will not order imaging at this time. If unable to control symptoms with medications will plan to obtain non-contrast CT abd/pelvis.   0830 Patient doing well. Pain well controlled. CBC and BMP nl. Patient unable to provide urine sample yet.   1030 UA with TMTC RBS, leukocytes, and rare bacteria. Patient continues to do well. Pain well controlled. Explained to patient no need for imaging as we have been able to control symptoms with medication and no signs/symptoms of obstructive nephrolithiasis. Patient voiced understanding and agreed with plan. Will d/c home with prescription for Norco 5 q6h PRN x4 days, flomax 0.4 mg daily x  4 days, and PO zofran 4 mg q8h PRN x4 days. Advised to return to ED if worsening pain despite medical therapy or significant decrease in urine volume.   Case discussed with Dr. Fayrene Fearing.   Final Clinical Impressions(s) / ED Diagnoses   Final diagnoses:  Nephrolithiasis    New Prescriptions Discharge Medication List as of 01/21/2017 10:35 AM    START taking these medications   Details  HYDROcodone-acetaminophen (NORCO/VICODIN) 5-325 MG tablet Take 1 tablet by mouth every 6 (six) hours as needed for moderate pain., Starting Thu 01/21/2017, Until Fri 01/21/2018, Print    ondansetron (ZOFRAN) 4 MG tablet Take 1 tablet (4 mg total) by mouth every 8 (eight) hours as needed for nausea or vomiting., Starting Thu 01/21/2017, Until Fri 01/21/2018, Print    tamsulosin (FLOMAX) 0.4 MG CAPS capsule Take 1 capsule (0.4 mg total) by mouth daily. Start taking on 10/12., Starting Thu 01/21/2017, Print         Burna Cash, MD 01/21/17 1105    Rolland Porter, MD 01/31/17 [phone]

## 2017-01-29 DIAGNOSIS — N202 Calculus of kidney with calculus of ureter: Secondary | ICD-10-CM | POA: Diagnosis not present

## 2017-02-01 DIAGNOSIS — N202 Calculus of kidney with calculus of ureter: Secondary | ICD-10-CM | POA: Diagnosis not present

## 2017-02-02 ENCOUNTER — Other Ambulatory Visit: Payer: Self-pay | Admitting: Urology

## 2017-02-03 ENCOUNTER — Encounter (HOSPITAL_BASED_OUTPATIENT_CLINIC_OR_DEPARTMENT_OTHER): Payer: Self-pay | Admitting: *Deleted

## 2017-02-03 NOTE — Progress Notes (Signed)
NPO AFTER MN. ARRIVE AT 0530.  CURRENT LAB RESULTS IN CHART AND EPIC.  WILL TAKE AM MEDS W/ SIPS OF WATER DOS.  MAY TAKE OXYCODONE/ ZOFRAN IF NEEDED.

## 2017-02-04 NOTE — Anesthesia Preprocedure Evaluation (Signed)
Anesthesia Evaluation  Patient identified by MRN, date of birth, ID band Patient awake    Reviewed: Allergy & Precautions, NPO status , Patient's Chart, lab work & pertinent test results  Airway Mallampati: II  TM Distance: >3 FB Neck ROM: Full    Dental no notable dental hx.    Pulmonary neg pulmonary ROS,    Pulmonary exam normal breath sounds clear to auscultation       Cardiovascular negative cardio ROS Normal cardiovascular exam Rhythm:Regular Rate:Normal     Neuro/Psych  Headaches, PSYCHIATRIC DISORDERS Depression negative neurological ROS  negative psych ROS   GI/Hepatic negative GI ROS, Neg liver ROS,   Endo/Other  negative endocrine ROS  Renal/GU negative Renal ROS  negative genitourinary   Musculoskeletal negative musculoskeletal ROS (+)   Abdominal   Peds negative pediatric ROS (+)  Hematology negative hematology ROS (+)   Anesthesia Other Findings   Reproductive/Obstetrics negative OB ROS                            Anesthesia Physical Anesthesia Plan  ASA: II  Anesthesia Plan: General   Post-op Pain Management:    Induction: Intravenous  PONV Risk Score and Plan: 2 and Ondansetron, Dexamethasone, Treatment may vary due to age or medical condition and Midazolam  Airway Management Planned: Oral ETT and LMA  Additional Equipment:   Intra-op Plan:   Post-operative Plan: Extubation in OR  Informed Consent:   Plan Discussed with:   Anesthesia Plan Comments: (  )        Anesthesia Quick Evaluation

## 2017-02-05 ENCOUNTER — Ambulatory Visit (HOSPITAL_BASED_OUTPATIENT_CLINIC_OR_DEPARTMENT_OTHER)
Admission: RE | Admit: 2017-02-05 | Discharge: 2017-02-05 | Disposition: A | Discharge status: Home / Self Care | Payer: BLUE CROSS/BLUE SHIELD | Source: Ambulatory Visit | Attending: Urology | Admitting: Urology

## 2017-02-05 ENCOUNTER — Ambulatory Visit (HOSPITAL_BASED_OUTPATIENT_CLINIC_OR_DEPARTMENT_OTHER): Payer: BLUE CROSS/BLUE SHIELD | Admitting: Anesthesiology

## 2017-02-05 ENCOUNTER — Encounter (HOSPITAL_BASED_OUTPATIENT_CLINIC_OR_DEPARTMENT_OTHER)
Admission: RE | Disposition: A | Discharge status: Home / Self Care | Payer: Self-pay | Source: Ambulatory Visit | Attending: Urology

## 2017-02-05 ENCOUNTER — Encounter (HOSPITAL_BASED_OUTPATIENT_CLINIC_OR_DEPARTMENT_OTHER): Payer: Self-pay | Admitting: *Deleted

## 2017-02-05 DIAGNOSIS — Z793 Long term (current) use of hormonal contraceptives: Secondary | ICD-10-CM | POA: Insufficient documentation

## 2017-02-05 DIAGNOSIS — Z79899 Other long term (current) drug therapy: Secondary | ICD-10-CM | POA: Diagnosis not present

## 2017-02-05 DIAGNOSIS — N201 Calculus of ureter: Secondary | ICD-10-CM | POA: Diagnosis not present

## 2017-02-05 DIAGNOSIS — N202 Calculus of kidney with calculus of ureter: Secondary | ICD-10-CM | POA: Diagnosis not present

## 2017-02-05 DIAGNOSIS — F329 Major depressive disorder, single episode, unspecified: Secondary | ICD-10-CM | POA: Diagnosis not present

## 2017-02-05 DIAGNOSIS — Z841 Family history of disorders of kidney and ureter: Secondary | ICD-10-CM | POA: Diagnosis not present

## 2017-02-05 DIAGNOSIS — F419 Anxiety disorder, unspecified: Secondary | ICD-10-CM | POA: Diagnosis not present

## 2017-02-05 DIAGNOSIS — N200 Calculus of kidney: Secondary | ICD-10-CM | POA: Diagnosis not present

## 2017-02-05 HISTORY — PX: URETEROSCOPY WITH HOLMIUM LASER LITHOTRIPSY: SHX6645

## 2017-02-05 HISTORY — DX: Attention-deficit hyperactivity disorder, unspecified type: F90.9

## 2017-02-05 HISTORY — DX: Calculus of ureter: N20.1

## 2017-02-05 HISTORY — DX: Urgency of urination: R39.15

## 2017-02-05 HISTORY — DX: Dysuria: R30.0

## 2017-02-05 HISTORY — DX: Hematuria, unspecified: R31.9

## 2017-02-05 HISTORY — DX: Frequency of micturition: R35.0

## 2017-02-05 HISTORY — DX: Headache: R51

## 2017-02-05 HISTORY — DX: Headache, unspecified: R51.9

## 2017-02-05 HISTORY — DX: Major depressive disorder, single episode, unspecified: F32.9

## 2017-02-05 HISTORY — DX: Depression, unspecified: F32.A

## 2017-02-05 HISTORY — DX: Personal history of urinary calculi: Z87.442

## 2017-02-05 SURGERY — URETEROSCOPY, WITH LITHOTRIPSY USING HOLMIUM LASER
Anesthesia: General | Site: Ureter | Laterality: Left

## 2017-02-05 MED ORDER — ONDANSETRON HCL 4 MG/2ML IJ SOLN
INTRAMUSCULAR | Status: AC
  Filled 2017-02-05: qty 2

## 2017-02-05 MED ORDER — LACTATED RINGERS IV SOLN
INTRAVENOUS | Status: DC
  Administered 2017-02-05 (×3): via INTRAVENOUS
  Filled 2017-02-05: qty 1000

## 2017-02-05 MED ORDER — GLYCOPYRROLATE 0.2 MG/ML IV SOSY
PREFILLED_SYRINGE | INTRAVENOUS | Status: AC
  Filled 2017-02-05: qty 5

## 2017-02-05 MED ORDER — FENTANYL CITRATE (PF) 100 MCG/2ML IJ SOLN
25.0000 ug | INTRAMUSCULAR | Status: DC | PRN
  Administered 2017-02-05 (×2): 25 ug via INTRAVENOUS
  Filled 2017-02-05: qty 1

## 2017-02-05 MED ORDER — TAMSULOSIN HCL 0.4 MG PO CAPS: 0.4000 mg | ORAL_CAPSULE | Freq: Every day | ORAL | 0 refills | Status: DC

## 2017-02-05 MED ORDER — PHENAZOPYRIDINE HCL 200 MG PO TABS: 200.0000 mg | ORAL_TABLET | Freq: Three times a day (TID) | ORAL | 0 refills | Status: DC | PRN

## 2017-02-05 MED ORDER — MIDAZOLAM HCL 2 MG/2ML IJ SOLN
INTRAMUSCULAR | Status: DC | PRN
  Administered 2017-02-05: 1 mg via INTRAVENOUS

## 2017-02-05 MED ORDER — BELLADONNA ALKALOIDS-OPIUM 16.2-60 MG RE SUPP
RECTAL | Status: AC
  Filled 2017-02-05: qty 1

## 2017-02-05 MED ORDER — BELLADONNA ALKALOIDS-OPIUM 16.2-60 MG RE SUPP
RECTAL | Status: DC | PRN
  Administered 2017-02-05: 1 via RECTAL

## 2017-02-05 MED ORDER — PHENAZOPYRIDINE HCL 100 MG PO TABS
ORAL_TABLET | ORAL | Status: AC
Start: 2017-02-05 — End: 2017-02-05
  Filled 2017-02-05: qty 1

## 2017-02-05 MED ORDER — PROPOFOL 10 MG/ML IV BOLUS
INTRAVENOUS | Status: DC | PRN
  Administered 2017-02-05: 200 mg via INTRAVENOUS

## 2017-02-05 MED ORDER — LIDOCAINE 2% (20 MG/ML) 5 ML SYRINGE
INTRAMUSCULAR | Status: AC
  Filled 2017-02-05: qty 5

## 2017-02-05 MED ORDER — MEPERIDINE HCL 25 MG/ML IJ SOLN
6.2500 mg | INTRAMUSCULAR | Status: DC | PRN
  Filled 2017-02-05: qty 1

## 2017-02-05 MED ORDER — DEXAMETHASONE SODIUM PHOSPHATE 10 MG/ML IJ SOLN
INTRAMUSCULAR | Status: AC
  Filled 2017-02-05: qty 1

## 2017-02-05 MED ORDER — LIDOCAINE HCL (CARDIAC) 20 MG/ML IV SOLN
INTRAVENOUS | Status: DC | PRN
  Administered 2017-02-05: 80 mg via INTRAVENOUS

## 2017-02-05 MED ORDER — LIDOCAINE HCL 2 % EX GEL
CUTANEOUS | Status: DC | PRN
  Administered 2017-02-05: 1

## 2017-02-05 MED ORDER — CEFAZOLIN SODIUM-DEXTROSE 2-4 GM/100ML-% IV SOLN
INTRAVENOUS | Status: AC
  Filled 2017-02-05: qty 100

## 2017-02-05 MED ORDER — SODIUM CHLORIDE 0.9 % IR SOLN
Status: DC | PRN
  Administered 2017-02-05: 3000 mL via INTRAVESICAL
  Administered 2017-02-05: 1000 mL via INTRAVESICAL

## 2017-02-05 MED ORDER — PROPOFOL 10 MG/ML IV BOLUS
INTRAVENOUS | Status: AC
  Filled 2017-02-05: qty 20

## 2017-02-05 MED ORDER — PHENAZOPYRIDINE HCL 200 MG PO TABS
200.0000 mg | ORAL_TABLET | Freq: Three times a day (TID) | ORAL | Status: DC
  Filled 2017-02-05: qty 1

## 2017-02-05 MED ORDER — DEXAMETHASONE SODIUM PHOSPHATE 4 MG/ML IJ SOLN
INTRAMUSCULAR | Status: DC | PRN
  Administered 2017-02-05: 8 mg via INTRAVENOUS

## 2017-02-05 MED ORDER — ONDANSETRON HCL 4 MG/2ML IJ SOLN
INTRAMUSCULAR | Status: DC | PRN
  Administered 2017-02-05: 4 mg via INTRAVENOUS

## 2017-02-05 MED ORDER — KETOROLAC TROMETHAMINE 30 MG/ML IJ SOLN
INTRAMUSCULAR | Status: DC | PRN
  Administered 2017-02-05: 30 mg via INTRAVENOUS

## 2017-02-05 MED ORDER — MIDAZOLAM HCL 2 MG/2ML IJ SOLN
INTRAMUSCULAR | Status: AC
  Filled 2017-02-05: qty 2

## 2017-02-05 MED ORDER — KETOROLAC TROMETHAMINE 30 MG/ML IJ SOLN
INTRAMUSCULAR | Status: AC
  Filled 2017-02-05: qty 1

## 2017-02-05 MED ORDER — IOHEXOL 300 MG/ML  SOLN
INTRAMUSCULAR | Status: DC | PRN
  Administered 2017-02-05: 5 mL

## 2017-02-05 MED ORDER — ONDANSETRON HCL 4 MG/2ML IJ SOLN
4.0000 mg | Freq: Once | INTRAMUSCULAR | Status: DC | PRN
  Filled 2017-02-05: qty 2

## 2017-02-05 MED ORDER — CIPROFLOXACIN HCL 500 MG PO TABS: 500.0000 mg | ORAL_TABLET | Freq: Once | ORAL | 0 refills | Status: AC

## 2017-02-05 MED ORDER — GLYCOPYRROLATE 0.2 MG/ML IJ SOLN
INTRAMUSCULAR | Status: DC | PRN
  Administered 2017-02-05: 0.1 mg via INTRAVENOUS

## 2017-02-05 MED ORDER — FENTANYL CITRATE (PF) 100 MCG/2ML IJ SOLN
INTRAMUSCULAR | Status: DC | PRN
  Administered 2017-02-05 (×2): 50 ug via INTRAVENOUS

## 2017-02-05 MED ORDER — FENTANYL CITRATE (PF) 100 MCG/2ML IJ SOLN
INTRAMUSCULAR | Status: AC
  Filled 2017-02-05: qty 2

## 2017-02-05 MED ORDER — CEFAZOLIN SODIUM-DEXTROSE 2-4 GM/100ML-% IV SOLN
2.0000 g | Freq: Once | INTRAVENOUS | Status: AC
  Administered 2017-02-05: 2 g via INTRAVENOUS
  Filled 2017-02-05: qty 100

## 2017-02-05 SURGICAL SUPPLY — 17 items
BAG DRAIN URO-CYSTO SKYTR STRL (DRAIN) ×2 IMPLANT
BAG DRN UROCATH (DRAIN) ×1
CATH URET 5FR 28IN OPEN ENDED (CATHETERS) ×2 IMPLANT
CLOTH BEACON ORANGE TIMEOUT ST (SAFETY) ×2 IMPLANT
EXTRACTOR STONE NITINOL NGAGE (UROLOGICAL SUPPLIES) ×1 IMPLANT
GLOVE BIO SURGEON STRL SZ7.5 (GLOVE) ×2 IMPLANT
GOWN STRL REUS W/TWL XL LVL3 (GOWN DISPOSABLE) ×2 IMPLANT
GUIDEWIRE STR DUAL SENSOR (WIRE) ×2 IMPLANT
INFUSOR MANOMETER BAG 3000ML (MISCELLANEOUS) ×2 IMPLANT
IV NS 1000ML (IV SOLUTION) ×2
IV NS 1000ML BAXH (IV SOLUTION) IMPLANT
IV NS IRRIG 3000ML ARTHROMATIC (IV SOLUTION) ×3 IMPLANT
KIT RM TURNOVER CYSTO AR (KITS) ×2 IMPLANT
MANIFOLD NEPTUNE II (INSTRUMENTS) ×1 IMPLANT
PACK CYSTO (CUSTOM PROCEDURE TRAY) ×2 IMPLANT
STENT URET 6FRX26 CONTOUR (STENTS) ×1 IMPLANT
TUBE CONNECTING 12X1/4 (SUCTIONS) ×1 IMPLANT

## 2017-02-05 NOTE — H&P (Signed)
Acute Kidney Stone  HPI: Julie GuarneriCourtney Mills is a 36 year-old female patient who was referred by Dr. Oliver PilaKathy W. Richardson, MD who is here for further eval and management of kidney stones.  She was diagnosed with a kidney stone on 01/18/2017. The [facility] with symptoms of a kidney stone.   Her pain started about 01/18/2017. The pain is on the left side.   Abdomen/Pelvic CT: No imaging. The patient underwent KUB prior to today's appointment.   The patient relates initially having flank pain and voiding symptoms. She is currently having flank pain, back pain, and irritative voiding symptoms. She denies having groin pain, nausea, vomiting, fever, and chills. She has not caught a stone in her urine strainer since her symptoms began.   She has never had surgical treatment for calculi in the past. This is not her first kidney stone. Her first stone was approximately 01/12/2011. She has had 1 stones prior to getting this one.   Patient has been experiencing back pain, pain and burning with urination, and urinary frequency.     ALLERGIES: None   MEDICATIONS: Birth Control  Prozac 20 mg capsule  Wellbutrin Sr     GU PSH: None   NON-GU PSH: None   GU PMH: None   NON-GU PMH: Anxiety Asthma    FAMILY HISTORY: 2 sons - Son nephrolithiasis - Father   SOCIAL HISTORY: Marital Status: Married Preferred Language: English; Ethnicity: Not Hispanic Or Latino; Race: White Current Smoking Status: Patient has never smoked.   Tobacco Use Assessment Completed: Used Tobacco in last 30 days? Has never drank.  Drinks 1 caffeinated drink per day. 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    REVIEW OF SYSTEMS:    GU Review Female:   Patient reports frequent urination, hard to postpone urination, burning /pain with urination, get up at night to urinate, trouble starting your stream, and have to strain to urinate. Patient denies leakage of urine, stream starts and stops, and being  pregnant.  Gastrointestinal (Upper):   Patient denies indigestion/ heartburn, vomiting, and nausea.  Gastrointestinal (Lower):   Patient reports diarrhea. Patient denies constipation.  Constitutional:   Patient reports fever and night sweats. Patient denies weight loss and fatigue.  Skin:   Patient denies skin rash/ lesion and itching.  Eyes:   Patient denies blurred vision and double vision.  Ears/ Nose/ Throat:   Patient denies sore throat and sinus problems.  Hematologic/Lymphatic:   Patient denies swollen glands and easy bruising.  Cardiovascular:   Patient denies leg swelling and chest pains.  Respiratory:   Patient denies cough and shortness of breath.  Endocrine:   Patient denies excessive thirst.  Musculoskeletal:   Patient reports back pain. Patient denies joint pain.  Neurological:   Patient reports headaches. Patient denies dizziness.  Psychologic:   Patient denies depression and anxiety.   VITAL SIGNS: None   MULTI-SYSTEM PHYSICAL EXAMINATION:    Constitutional: Well-nourished. No physical deformities. Normally developed. Good grooming.  Neck: Neck symmetrical, not swollen. Normal tracheal position.  Respiratory: No labored breathing, no use of accessory muscles.   Cardiovascular: Normal temperature, normal extremity pulses, no swelling, no varicosities.  Lymphatic: No enlargement of neck, axillae, groin.  Skin: No paleness, no jaundice, no cyanosis. No lesion, no ulcer, no rash.  Neurologic / Psychiatric: Oriented to time, oriented to place, oriented to person. No depression, no anxiety, no agitation.  Gastrointestinal: Left CVA tenderness and left lower quadrant pain to palpation.  Eyes: Normal conjunctivae. Normal  eyelids.  Ears, Nose, Mouth, and Throat: Left ear no scars, no lesions, no masses. Right ear no scars, no lesions, no masses. Nose no scars, no lesions, no masses. Normal hearing. Normal lips.  Musculoskeletal: Normal gait and station of head and neck.     PAST  DATA REVIEWED:  Source Of History:  Patient  Records Review:   Previous Doctor Records, Previous Patient Records   PROCEDURES:         KUB - [id number]  A single view of the abdomen is obtained. Renal shadows are easily visualized bilaterally. There are several small calcifications in the right renal pelvis shadows.  There is a 3.5 x 4mm stone in the left distal ureter at the UVJ Gas pattern is grossly normal. No significant bony abnormalities.      Impression: The patient likely has nonobstructing stones in the right kidney and a 3.54 mm stone in the left UVJ region.         Urinalysis w/Scope Dipstick Dipstick Cont'd Micro  Color: Yellow Bilirubin: Neg WBC/hpf: 0 - 5/hpf  Appearance: Clear Ketones: Trace RBC/hpf: 0 - 2/hpf  Specific Gravity: 1.020 Blood: Trace Bacteria: Few (10-25/hpf)  pH: 5.5 Protein: Neg Cystals: NS (Not Seen)  Glucose: Neg Urobilinogen: 0.2 Casts: NS (Not Seen)    Nitrites: Neg Trichomonas: Not Present    Leukocyte Esterase: Neg Mucous: Present      Epithelial Cells: 0 - 5/hpf      Yeast: NS (Not Seen)      Sperm: Not Present    ASSESSMENT:      ICD-10 Details  1 GU:   Renal and ureteral calculus - N20.2    PLAN:            Medications New Meds: Tamsulosin Hcl 0.4 mg capsule, ext release 24 hr 1 capsule PO Daily   #30  0 Refill(s)  Ultram 50 mg tablet 1-2 tablet PO Q 6 H   #30  0 Refill(s)  Zofran 4 mg tablet 1 tablet PO Q 4 H   #20  1 Refill(s)            Schedule Return Visit/Planned Activity: 2 Weeks          Document Letter(s):  Created for Patient: Clinical Summary    We discussed the patients options for management of their obstructing stone. At this point, the patients pain is well controlled and they are doing well with medical expulsion therapy. As such, the patient would like to try and pass this stone on their own. I have provided return precautions including fevers/chills or signs of infection. We also discussed proceeding with more  aggressive means for poorly controlled pain or severe nausea/vomiting. I will plan to see the patient in 2 weeks or as needed.

## 2017-02-05 NOTE — Op Note (Signed)
Preoperative diagnosis: left ureteral calculus  Postoperative diagnosis: left ureteral calculus  Procedure:  1. Cystoscopy 2. left ureteroscopy and stone removal 3. left 59F x 26cm ureteral stent placement  4. left retrograde pyelography with interpretation  Surgeon: Crist FatBenjamin W. Herrick, MD  Anesthesia: General  Complications: None  Intraoperative findings: left retrograde pyelography demonstrated a filling defect within the left ureter consistent with the patient's known calculus without other abnormalities.  EBL: Minimal  Specimens: 1. left ureteral calculus  Disposition of specimens: [facility] for stone analysis  Indication: Julie Mills is a 36 y.o.   patient with a left ureteral stone and associated left symptoms. After reviewing the management options for treatment, the patient elected to proceed with the above surgical procedure(s). We have discussed the potential benefits and risks of the procedure, side effects of the proposed treatment, the likelihood of the patient achieving the goals of the procedure, and any potential problems that might occur during the procedure or recuperation. Informed consent has been obtained.   Description of procedure:  The patient was taken to the operating room and general anesthesia was induced.  The patient was placed in the dorsal lithotomy position, prepped and draped in the usual sterile fashion, and preoperative antibiotics were administered. A preoperative time-out was performed.   Cystourethroscopy was performed.  The patient's urethra was examined and was normal.  The bladder was then systematically examined in its entirety. There was no evidence for any bladder tumors, stones, or other mucosal pathology.    Attention then turned to the left ureteral orifice and a ureteral catheter was used to intubate the ureteral orifice.  Omnipaque contrast was injected through the ureteral catheter and a retrograde  pyelogram was performed with findings as dictated above.  A 0.38 sensor guidewire was then advanced up the left ureter into the renal pelvis under fluoroscopic guidance. The 6 Fr semirigid ureteroscope was then advanced into the ureter next to the guidewire and the calculus was identified.  The stone was then removed from the ureter with an N-gage nitinol basket.  Reinspection of the ureter revealed no remaining visible stones or fragments.   The wire was then backloaded through the cystoscope and a ureteral stent was advance over the wire using Seldinger technique.  The stent was positioned appropriately under fluoroscopic and cystoscopic guidance.  The wire was then removed with an adequate stent curl noted in the renal pelvis as well as in the bladder.  The bladder was then emptied and the procedure ended.  The patient appeared to tolerate the procedure well and without complications.  The patient was able to be awakened and transferred to the recovery unit in satisfactory condition.   Disposition: The tether of the stent was left on and  tucked inside the patient's vagina.  Instructions for removing the stent have been provided to the patient. The patient has been scheduled for followup in 6 weeks with a renal ultrasound.

## 2017-02-05 NOTE — Interval H&P Note (Signed)
History and Physical Interval Note:  02/05/2017 7:27 AM  Julie Mills  has presented today for surgery, with the diagnosis of LEFT URETERAL STONE  The various methods of treatment have been discussed with the patient and family. After consideration of risks, benefits and other options for treatment, the patient has consented to  Procedure(s) with comments: URETEROSCOPY WITH HOLMIUM LASER LITHOTRIPSY/ STENT PLACEMENT (Left) - NEEDS 60 MIN FOR PROCEDURE as a surgical intervention .  The patient's history has been reviewed, patient examined, no change in status, stable for surgery.  I have reviewed the patient's chart and labs.  Questions were answered to the patient's satisfaction.     Berniece SalinesHERRICK, BENJAMIN W

## 2017-02-05 NOTE — Discharge Instructions (Signed)
DISCHARGE INSTRUCTIONS FOR KIDNEY STONE/URETERAL STENT   MEDICATIONS:  1.  Resume all your other meds from home - except do not take any extra narcotic pain meds that you may have at home.  2. Pyridium is to help with the burning/stinging when you urinate. 3. Take the Oxycodone prescribed to you earlier this week for extreme pain.  Otherwise take Ibuprofen 800mg  every eight hours as needed for mild/moderate pain. 4. Take Cipro one hour prior to removal of your stent.   ACTIVITY:  1. No strenuous activity x 1week  2. No driving while on narcotic pain medications  3. Drink plenty of water  4. Continue to walk at home - you can still get blood clots when you are at home, so keep active, but don't over do it.  5. May return to work/school tomorrow or when you feel ready   BATHING:  1. You can shower and we recommend daily showers  2. You have a string coming from your urethra: The stent string is attached to your ureteral stent. Do not pull on this.   SIGNS/SYMPTOMS TO CALL:  Please call us if you have a fever greater than 101.5, uncontrolled nausea/vomiting, uncontrolled pain, dizziness, unable to urinate, bloody urine, chest pain, shortness of breath, leg swelling, leg pain, redness around wound, drainage from wound, or any other concerns or questions.   You can reach us at [phone].   FOLLOW-UP:  1. You have an appointment in 6 weeks with a ultrasound of your kidneys prior. 2. You have a string attached to your stent, you may remove it on Monday, October 29th. To do this, pull the strings until the stents are completely removed. You may feel an odd sensation in your back.    Post Anesthesia Home Care Instructions  Activity: Get plenty of rest for the remainder of the day. A responsible individual must stay with you for 24 hours following the procedure.  For the next 24 hours, DO NOT: -Drive a car -Advertising copywriterperate machinery -Drink alcoholic beverages -Take any medication unless  instructed by your physician -Make any legal decisions or sign important papers.  Meals: Start with liquid foods such as gelatin or soup. Progress to regular foods as tolerated. Avoid greasy, spicy, heavy foods. If nausea and/or vomiting occur, drink only clear liquids until the nausea and/or vomiting subsides. Call your physician if vomiting continues.  Special Instructions/Symptoms: Your throat may feel dry or sore from the anesthesia or the breathing tube placed in your throat during surgery. If this causes discomfort, gargle with warm salt water. The discomfort should disappear within 24 hours.  If you had a scopolamine patch placed behind your ear for the management of post- operative nausea and/or vomiting:  1. The medication in the patch is effective for 72 hours, after which it should be removed.  Wrap patch in a tissue and discard in the trash. Wash hands thoroughly with soap and water. 2. You may remove the patch earlier than 72 hours if you experience unpleasant side effects which may include dry mouth, dizziness or visual disturbances. 3. Avoid touching the patch. Wash your hands with soap and water after contact with the patch.

## 2017-02-05 NOTE — Anesthesia Procedure Notes (Signed)
Procedure Name: LMA Insertion Date/Time: 02/05/2017 7:36 AM Performed by: Earmon PhoenixWILKERSON, Georgi Navarrete P Pre-anesthesia Checklist: Patient identified, Emergency Drugs available, Suction available, Patient being monitored and Timeout performed Patient Re-evaluated:Patient Re-evaluated prior to induction Oxygen Delivery Method: Circle system utilized Preoxygenation: Pre-oxygenation with 100% oxygen Induction Type: IV induction Ventilation: Mask ventilation without difficulty LMA: LMA inserted LMA Size: 4.0 Number of attempts: 1 Placement Confirmation: positive ETCO2,  CO2 detector and breath sounds checked- equal and bilateral Tube secured with: Tape Dental Injury: Teeth and Oropharynx as per pre-operative assessment

## 2017-02-05 NOTE — Transfer of Care (Signed)
Immediate Anesthesia Transfer of Care Note  Patient: Julie Mills  Procedure(s) Performed: CYSTOSCOPY, RETROGRADE / PYELOGRAM, URETEROSCOPY WITH STONE BASKETRY / STENT PLACEMENT (Left Ureter)  Patient Location: PACU  Anesthesia Type:General  Level of Consciousness: awake and patient cooperative  Airway & Oxygen Therapy: Patient Spontanous Breathing and Patient connected to nasal cannula oxygen  Post-op Assessment: Report given to RN and Post -op Vital signs reviewed and stable  Post vital signs: Reviewed and stable  Last Vitals:  Vitals:   02/05/17 0516 02/05/17 0823  BP: 140/88   Pulse: 89   Resp: 14   Temp: 37.1 C 36.7 C  SpO2: 100%     Last Pain:  Vitals:   02/05/17 0534  TempSrc:   PainSc: 5       Patients Stated Pain Goal: 8 (02/05/17 0534)  Complications: No apparent anesthesia complications

## 2017-02-06 ENCOUNTER — Emergency Department (HOSPITAL_COMMUNITY)
Admission: EM | Admit: 2017-02-06 | Discharge: 2017-02-06 | Disposition: A | Discharge status: Home / Self Care | Payer: BLUE CROSS/BLUE SHIELD | Attending: Emergency Medicine | Admitting: Emergency Medicine

## 2017-02-06 ENCOUNTER — Emergency Department (HOSPITAL_COMMUNITY): Payer: BLUE CROSS/BLUE SHIELD

## 2017-02-06 ENCOUNTER — Encounter (HOSPITAL_COMMUNITY): Payer: Self-pay

## 2017-02-06 DIAGNOSIS — R109 Unspecified abdominal pain: Secondary | ICD-10-CM

## 2017-02-06 DIAGNOSIS — Z79899 Other long term (current) drug therapy: Secondary | ICD-10-CM | POA: Insufficient documentation

## 2017-02-06 DIAGNOSIS — Z960 Presence of urogenital implants: Secondary | ICD-10-CM | POA: Insufficient documentation

## 2017-02-06 DIAGNOSIS — R1032 Left lower quadrant pain: Secondary | ICD-10-CM | POA: Diagnosis not present

## 2017-02-06 DIAGNOSIS — R300 Dysuria: Secondary | ICD-10-CM | POA: Diagnosis not present

## 2017-02-06 DIAGNOSIS — N132 Hydronephrosis with renal and ureteral calculous obstruction: Secondary | ICD-10-CM | POA: Diagnosis not present

## 2017-02-06 DIAGNOSIS — R319 Hematuria, unspecified: Secondary | ICD-10-CM | POA: Diagnosis not present

## 2017-02-06 DIAGNOSIS — Z87442 Personal history of urinary calculi: Secondary | ICD-10-CM | POA: Insufficient documentation

## 2017-02-06 DIAGNOSIS — G8918 Other acute postprocedural pain: Secondary | ICD-10-CM | POA: Diagnosis not present

## 2017-02-06 LAB — URINALYSIS, ROUTINE W REFLEX MICROSCOPIC
Bilirubin Urine: NEGATIVE
Glucose, UA: 250 mg/dL — AB
Ketones, ur: NEGATIVE mg/dL
Nitrite: POSITIVE — AB
Protein, ur: 100 mg/dL — AB
Specific Gravity, Urine: >1.03 — ABNORMAL HIGH (ref 1.005–1.030)
pH: 6.5 (ref 5.0–8.0)

## 2017-02-06 LAB — PREGNANCY, URINE: PREG TEST UR: NEGATIVE

## 2017-02-06 LAB — URINALYSIS, MICROSCOPIC (REFLEX)

## 2017-02-06 MED ORDER — KETOROLAC TROMETHAMINE 30 MG/ML IJ SOLN
30.0000 mg | Freq: Once | INTRAMUSCULAR | Status: AC
  Administered 2017-02-06: 30 mg via INTRAVENOUS
  Filled 2017-02-06: qty 1

## 2017-02-06 MED ORDER — HYDROMORPHONE HCL 1 MG/ML IJ SOLN
1.0000 mg | Freq: Once | INTRAMUSCULAR | Status: AC
  Administered 2017-02-06: 1 mg via INTRAVENOUS
  Filled 2017-02-06: qty 1

## 2017-02-06 MED ORDER — ONDANSETRON HCL 4 MG/2ML IJ SOLN
4.0000 mg | Freq: Once | INTRAMUSCULAR | Status: AC
  Administered 2017-02-06: 4 mg via INTRAVENOUS
  Filled 2017-02-06: qty 2

## 2017-02-06 MED ORDER — OXYCODONE-ACETAMINOPHEN 5-325 MG PO TABS
1.0000 | ORAL_TABLET | Freq: Once | ORAL | Status: AC
  Administered 2017-02-06: 1 via ORAL
  Filled 2017-02-06: qty 1

## 2017-02-06 MED ORDER — OXYCODONE-ACETAMINOPHEN 5-325 MG PO TABS: 1.0000 | ORAL_TABLET | ORAL | 0 refills | Status: DC | PRN

## 2017-02-06 NOTE — ED Provider Notes (Signed)
[facility]-EMERGENCY DEPT Provider Note   CSN: [id number] Arrival date & time: 02/06/17  0557     History   Chief Complaint Chief Complaint  Patient presents with  . Flank Pain    HPI Julie Mills is a 36 y.o. female.  HPI Julie Mills is a 36 y.o. female with history of kidney stones, depression, anxiety, presents to emergency department complaining of severe left-sided pain.  Patient states she had a kidney stone removed yesterday and stent placed by Dr. Marlou Porch.  She states she felt well yesterday.  She states around 1 AM, her pain came back.  States pain is sharp, stabbing.  Pain is in the left lower quadrant radiates into the groin.  Reports some pressure with urination and hematuria.  Denies any nausea or vomiting.  Changes in bowels.  No fever or chills.  Took tramadol which did not help.  Past Medical History:  Diagnosis Date  . ADHD   . Anxiety   . Depression   . Dysuria   . Frequency of urination   . Headache   . Hematuria   . History of kidney stones   . Left ureteral stone   . Urgency of urination     There are no active problems to display for this patient.   Past Surgical History:  Procedure Laterality Date  . BREAST ENHANCEMENT SURGERY Bilateral 2016  . EXCISION FORGEIN BODY LEFT FOREARM  11/07/2004    OB History    Gravida Para Term Preterm AB Living   1             SAB TAB Ectopic Multiple Live Births                   Home Medications    Prior to Admission medications   Medication Sig Start Date End Date Taking? Authorizing Provider  ADDERALL XR 30 MG 24 hr capsule Take 30 mg by mouth daily as needed (for ADHD).  12/30/16   [provider]  ALPRAZolam Prudy Feeler) 0.5 MG tablet Take 0.5 mg by mouth 3 (three) times daily as needed for anxiety. 01/16/17   [provider]  buPROPion (WELLBUTRIN XL) 150 MG 24 hr tablet Take 150 mg by mouth every morning.     [provider]  FLUoxetine  (PROZAC) 40 MG capsule Take 40 mg by mouth every morning.     [provider]  ibuprofen (ADVIL,MOTRIN) 200 MG tablet Take 400 mg by mouth every 6 (six) hours as needed for fever, headache, mild pain, moderate pain or cramping.    [provider]  Levonorgestrel-Ethinyl Estradiol (SEASONIQUE) 0.15-0.03 &0.01 MG tablet Take 1 tablet by mouth every morning.     [provider]  ondansetron (ZOFRAN) 4 MG tablet Take 1 tablet (4 mg total) by mouth every 8 (eight) hours as needed for nausea or vomiting. 01/21/17 01/21/18  Burna Cash, MD  oxyCODONE-acetaminophen (PERCOCET/ROXICET) 5-325 MG tablet Take by mouth every 4 (four) hours as needed for severe pain.    [provider]  phenazopyridine (PYRIDIUM) 200 MG tablet Take 1 tablet (200 mg total) by mouth 3 (three) times daily as needed for pain. 02/05/17   Crist Fat, MD  tamsulosin (FLOMAX) 0.4 MG CAPS capsule Take 1 capsule (0.4 mg total) by mouth daily. Take until the stent is removed. 02/05/17   Crist Fat, MD    Family History Family History  Problem Relation Age of Onset  . Hyperlipidemia Father   .  Cancer Maternal Grandmother   . Diabetes Maternal Grandmother   . Heart disease Maternal Grandmother   . Cancer Maternal Grandfather   . Diabetes Maternal Grandfather   . Heart disease Maternal Grandfather   . Stroke Maternal Grandfather     Social History Social History  Substance Use Topics  . Smoking status: Never Smoker  . Smokeless tobacco: Never Used  . Alcohol use No     Allergies   Patient has no known allergies.   Review of Systems Review of Systems  Constitutional: Negative for chills and fever.  Respiratory: Negative for cough, chest tightness and shortness of breath.   Cardiovascular: Negative for chest pain, palpitations and leg swelling.  Gastrointestinal: Positive for abdominal pain. Negative for diarrhea, nausea and vomiting.  Genitourinary: Positive  for dysuria, flank pain and hematuria. Negative for pelvic pain, vaginal bleeding, vaginal discharge and vaginal pain.  Musculoskeletal: Negative for arthralgias, myalgias, neck pain and neck stiffness.  Skin: Negative for rash.  Neurological: Negative for dizziness, weakness and headaches.  All other systems reviewed and are negative.    Physical Exam Updated Vital Signs BP (!) 148/96 (BP Location: Left Arm)   Pulse (!) 101   Temp 98.3 F (36.8 C) (Oral)   LMP 01/13/2017 (Approximate)   SpO2 95%   Physical Exam  Constitutional: She appears well-developed and well-nourished. No distress.  HENT:  Head: Normocephalic.  Eyes: Conjunctivae are normal.  Neck: Neck supple.  Cardiovascular: Normal rate, regular rhythm and normal heart sounds.   Pulmonary/Chest: Effort normal and breath sounds normal. No respiratory distress. She has no wheezes. She has no rales.  Abdominal: Soft. Bowel sounds are normal. She exhibits no distension. There is tenderness. There is no rebound.  LLQ tenderness  Musculoskeletal: She exhibits no edema.  Neurological: She is alert.  Skin: Skin is warm and dry.  Psychiatric: She has a normal mood and affect. Her behavior is normal.  Nursing note and vitals reviewed.    ED Treatments / Results  Labs (all labs ordered are listed, but only abnormal results are displayed) Labs Reviewed  URINALYSIS, ROUTINE W REFLEX MICROSCOPIC - Abnormal; Notable for the following:       Result Value   Color, Urine AMBER (*)    APPearance CLOUDY (*)    Specific Gravity, Urine >1.030 (*)    Glucose, UA 250 (*)    Hgb urine dipstick LARGE (*)    Protein, ur 100 (*)    Nitrite POSITIVE (*)    Leukocytes, UA TRACE (*)    All other components within normal limits  URINALYSIS, MICROSCOPIC (REFLEX) - Abnormal; Notable for the following:    Bacteria, UA RARE (*)    Squamous Epithelial / LPF 0-5 (*)    All other components within normal limits  URINE CULTURE  PREGNANCY,  URINE    EKG  EKG Interpretation None       Radiology [place] Renal  Result Date: 02/06/2017 CLINICAL DATA:  Renal calculi.  Left ureter stent placed yesterday. EXAM: RENAL / URINARY TRACT ULTRASOUND COMPLETE COMPARISON:  CT from 10/16/2010 FINDINGS: Right Kidney: Length: 11.6 cm. Normal echogenicity. Mild fullness of the lower calyx. Findings compatible with mild hydronephrosis. No suspicious right renal lesion. Left Kidney: Length: 10.7 cm. Normal echogenicity. Negative for left hydronephrosis. Echogenic structure in the left renal hilum is suggestive for the recently placed ureter stent. No suspicious renal lesion. Bladder: Bladder is decompressed and poorly evaluated. IMPRESSION: Mild right hydronephrosis in the right lower pole. No left  hydronephrosis. Electronically Signed   By: Richarda Overlie M.D.   On: 02/06/2017 09:00    Procedures Procedures (including critical care time)  Medications Ordered in ED Medications  HYDROmorphone (DILAUDID) injection 1 mg (not administered)  ondansetron (ZOFRAN) injection 4 mg (not administered)     Initial Impression / Assessment and Plan / ED Course  I have reviewed the triage vital signs and the nursing notes.  Pertinent labs & imaging results that were available during my care of the patient were reviewed by me and considered in my medical decision making (see chart for details).    Patient in emergency department with sudden onset of severe left lower quadrant abdominal pain that started around 1 AM.  Patient had a stent placed yesterday for obstructing renal stone.  Stone was evacuated.  Patient took tramadol with no relief.  Will check urine to make sure there is no signs of infection.  Will get ultrasound to rule out hydronephrosis.  Discussed with Dr. Freida Busman who agrees with the plan.  Will consult urology.   Spoke with Dr. Sande Brothers with urology, advised Toradol in the ED, home with Percocet or Vicodin for pain control.  Follow-up as needed.     11:14 AM Patient's pain is much improved after 1 mg of Dilaudid, Toradol, and Percocet.  Patient is comfortable going home.  Discussed plan.  Will give prescription for Percocet.  Patient was told to pull out her stent on Monday, advised to follow through if doing okay.  Return precautions discussed.  Vitals:   02/06/17 0702 02/06/17 0730 02/06/17 1000 02/06/17 1100  BP: (!) 140/96 (!) 136/102 (!) 148/110 (!) 135/93  Pulse: 87 72 75 76  Resp: 18 17 14 17   Temp:      TempSrc:      SpO2: 96% 98% 95% 96%     Final Clinical Impressions(s) / ED Diagnoses   Final diagnoses:  Post-operative pain  Flank pain    New Prescriptions New Prescriptions   OXYCODONE-ACETAMINOPHEN (PERCOCET) 5-325 MG TABLET    Take 1 tablet by mouth every 4 (four) hours as needed for severe pain.    Jaynie Crumble, PA-C 02/06/17 1117  Lorre Nick, MD 02/07/17 [phone]

## 2017-02-06 NOTE — Discharge Instructions (Signed)
Take percocet as prescribed for pain relief. Follow up with urology as instructed by them. Return if worsening pain/symptoms, if fever, chills, malaise.

## 2017-02-06 NOTE — ED Triage Notes (Signed)
Pt had a stent placed yesterday for a kidney stone and this am she's in severe pain

## 2017-02-08 ENCOUNTER — Encounter (HOSPITAL_BASED_OUTPATIENT_CLINIC_OR_DEPARTMENT_OTHER): Payer: Self-pay | Admitting: Urology

## 2017-02-08 LAB — URINE CULTURE: CULTURE: NO GROWTH

## 2017-02-08 LAB — I-STAT CG4 LACTIC ACID, ED: LACTIC ACID, VENOUS: 5.07 mmol/L — AB (ref 0.5–1.9)

## 2017-02-09 ENCOUNTER — Encounter (HOSPITAL_BASED_OUTPATIENT_CLINIC_OR_DEPARTMENT_OTHER): Payer: Self-pay | Admitting: Urology

## 2017-02-09 NOTE — Anesthesia Postprocedure Evaluation (Signed)
Anesthesia Post Note  Patient: Julie Mills  Procedure(s) Performed: CYSTOSCOPY, RETROGRADE / PYELOGRAM, URETEROSCOPY WITH STONE BASKETRY / STENT PLACEMENT (Left Ureter)     Patient location during evaluation: PACU Anesthesia Type: General Level of consciousness: awake and alert Pain management: pain level controlled Vital Signs Assessment: post-procedure vital signs reviewed and stable Respiratory status: spontaneous breathing, nonlabored ventilation, respiratory function stable and patient connected to nasal cannula oxygen Cardiovascular status: blood pressure returned to baseline and stable Postop Assessment: no apparent nausea or vomiting Anesthetic complications: no    Last Vitals:  Vitals:   02/06/17 1100 02/06/17 1130  BP: (!) 135/93 (!) 130/91  Pulse: 76 74  Resp: 17 15  Temp:    SpO2: 96% 96%    Last Pain:  Vitals:   02/06/17 1156  TempSrc:   PainSc: 2    Pain Goal:                 Rishawn Walck

## 2017-02-11 ENCOUNTER — Encounter (HOSPITAL_BASED_OUTPATIENT_CLINIC_OR_DEPARTMENT_OTHER): Payer: Self-pay | Admitting: Urology

## 2017-02-11 NOTE — Addendum Note (Signed)
Addendum  created 02/11/17 0924 by Bethena Midgetddono, Shondell Fabel, MD   Anesthesia Event edited, Anesthesia Staff edited

## 2017-02-16 DIAGNOSIS — N202 Calculus of kidney with calculus of ureter: Secondary | ICD-10-CM | POA: Diagnosis not present

## 2017-03-25 DIAGNOSIS — J069 Acute upper respiratory infection, unspecified: Secondary | ICD-10-CM | POA: Diagnosis not present

## 2017-03-25 DIAGNOSIS — Z6822 Body mass index (BMI) 22.0-22.9, adult: Secondary | ICD-10-CM | POA: Diagnosis not present

## 2017-03-25 DIAGNOSIS — Z01419 Encounter for gynecological examination (general) (routine) without abnormal findings: Secondary | ICD-10-CM | POA: Diagnosis not present

## 2017-03-25 DIAGNOSIS — Z1389 Encounter for screening for other disorder: Secondary | ICD-10-CM | POA: Diagnosis not present

## 2017-03-25 DIAGNOSIS — Z3041 Encounter for surveillance of contraceptive pills: Secondary | ICD-10-CM | POA: Diagnosis not present

## 2017-03-30 DIAGNOSIS — F422 Mixed obsessional thoughts and acts: Secondary | ICD-10-CM | POA: Diagnosis not present

## 2017-03-30 DIAGNOSIS — F410 Panic disorder [episodic paroxysmal anxiety] without agoraphobia: Secondary | ICD-10-CM | POA: Diagnosis not present

## 2017-03-30 DIAGNOSIS — F900 Attention-deficit hyperactivity disorder, predominantly inattentive type: Secondary | ICD-10-CM | POA: Diagnosis not present

## 2017-07-08 ENCOUNTER — Ambulatory Visit (INDEPENDENT_AMBULATORY_CARE_PROVIDER_SITE_OTHER): Payer: BLUE CROSS/BLUE SHIELD

## 2017-07-08 ENCOUNTER — Ambulatory Visit (INDEPENDENT_AMBULATORY_CARE_PROVIDER_SITE_OTHER): Payer: BLUE CROSS/BLUE SHIELD | Admitting: Physician Assistant

## 2017-07-08 VITALS — BP 71/45 | HR 110 | Temp 98.0°F | Resp 16 | Ht 67.0 in | Wt 153.0 lb

## 2017-07-08 DIAGNOSIS — M25532 Pain in left wrist: Secondary | ICD-10-CM

## 2017-07-08 DIAGNOSIS — S63502A Unspecified sprain of left wrist, initial encounter: Secondary | ICD-10-CM | POA: Diagnosis not present

## 2017-07-08 DIAGNOSIS — M79632 Pain in left forearm: Secondary | ICD-10-CM | POA: Diagnosis not present

## 2017-07-08 DIAGNOSIS — M79642 Pain in left hand: Secondary | ICD-10-CM | POA: Diagnosis not present
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
## 2017-07-08 NOTE — Patient Instructions (Addendum)
Please continue to ice thee times per day for 15 minutes. I would like you to come out of the wrist splint in 24 hours.  And do light range of motion three times per day as well. You can take ibuprofen, 600mg  every 8 hours for the pain.  Take with food.     [facility] [address] [place], [place] [address]   Shanon Rosser [phone]  Maris Berger [phone]  Marco Collie [phone]   [facility] ([address] Hulan Amato Beckey Rutter Joycelyn Schmid [place]) - [phone]  [facility] [place]) - [phone]  [facility] - [phone]  [facility] - [phone]  Buena Irish - [phone]  [facility]  - [phone] (do not file insurance)   Wrist Sprain, Adult A wrist sprain is a stretch or tear in the strong, fibrous tissues (ligaments) that connect your wrist bones. There are three types of wrist sprains:  Grade 1. In this type of sprain, the ligament is stretched more than normal.  Grade 2. In this type of sprain, the ligament is partially torn. You may be able to move your wrist, but not very much.  Grade 3. In this type of sprain, the ligament or muscle is completely torn. You may find it difficult or extremely painful to move your wrist even a little.  What are the causes? A wrist sprain can be caused by using the wrist too much during sports, exercise, or at work. It can also happen with a fall or during an accident. What increases the risk? This condition is more likely to occur in people:  With a previous wrist or arm injury.  With poor wrist strength and flexibility.  Who play contact sports, such as football or soccer.  Who play sports that may result in a fall, such as skateboarding, biking, skiing, or snowboarding.  Who do not exercise regularly.  Who use exercise equipment that does not fit well.  What are  the signs or symptoms? Symptoms of this condition include:  Pain in the wrist, arm, or hand.  Swelling or bruised skin near the wrist, hand, or arm. The skin may look yellow or kind of blue.  Stiffness or trouble moving the hand.  Hearing a pop or feeling a tear at the time of the injury.  A warm feeling in the skin around the wrist.  How is this diagnosed? This condition is diagnosed with a physical exam. Sometimes an X-ray is taken to make sure a bone did not break. If your health care provider thinks that you tore a ligament, he or she may order an MRI of your wrist. How is this treated? This condition is treated by resting and applying ice to your wrist. Additional treatment may include:  Medicine for pain and inflammation.  A splint to keep your wrist still (immobilized).  Exercises to strengthen and stretch your wrist.  Surgery. This may be done if the ligament is completely torn.  Follow these instructions at home: If you have a splint:   Do not put pressure on any part of the splint until it is fully hardened. This may take several hours.  Wear the splint as told by your health care provider. Remove it only as told by your health care provider.  Loosen the splint if your fingers tingle, become numb, or turn cold and blue.  If your splint is not waterproof: ? Do not let it get wet. ? Cover it  with a watertight covering when you take a bath or a shower.  Keep the splint clean. Managing pain, stiffness, and swelling   If directed, put ice on the injured area. ? If you have a removable splint, remove it as told by your health care provider. ? Put ice in a plastic bag. ? Place a towel between your skin and the bag or between the splint and the bag. ? Leave the ice on for 20 minutes, 2-3 times per day.  Move your fingers often to avoid stiffness and to lessen swelling.  Raise (elevate) the injured area above the level of your heart while you are sitting or lying  down. Activity  Rest your wrist. Do not do things that cause pain.  Return to your normal activities as told by your health care provider. Ask your health care provider what activities are safe for you.  Do exercises as told by your health care provider. General instructions  Take over-the-counter and prescription medicines only as told by your health care provider.  Do not use any products that contain nicotine or tobacco, such as cigarettes and e-cigarettes. These can delay healing. If you need help quitting, ask your health care provider.  Ask your health care provider when it is safe to drive if you have a splint.  Keep all follow-up visits as told by your health care provider. This is important. Contact a health care provider if:  Your pain, bruising, or swelling gets worse.  Your skin becomes red, gets a rash, or has open sores.  Your pain does not get better or it gets worse. Get help right away if:  You have a new or sudden sharp pain in the hand, arm, or wrist.  You have tingling or numbness in your hand.  Your fingers turn white, very red, or cold and blue.  You cannot move your fingers. This information is not intended to replace advice given to you by your health care provider. Make sure you discuss any questions you have with your health care provider. Document Released: 12/01/2013 Document Revised: 10/26/2015 Document Reviewed: 10/17/2015 Elsevier Interactive Patient Education  2018 [employer]      IF you received an x-ray today, you will receive an invoice from [facility]. Please contact [facility] at [phone] with questions or concerns regarding your invoice.   IF you received labwork today, you will receive an invoice from [facility]. Please contact [facility] at [phone] with questions or concerns regarding your invoice.   Our billing staff will not be able to assist you with questions regarding bills from these  companies.  You will be contacted with the lab results as soon as they are available. The fastest way to get your results is to activate your My Chart account. Instructions are located on the last page of this paperwork. If you have not heard from us regarding the results in 2 weeks, please contact this office.

## 2017-07-09 ENCOUNTER — Telehealth: Payer: Self-pay | Admitting: Physician Assistant
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
## 2017-07-12 NOTE — Telephone Encounter (Signed)
Lm to call back on status of pain?  If patient is still having pain she would need to be seen again before something stronger could be called in.

## 2017-07-13 ENCOUNTER — Encounter: Payer: Self-pay | Admitting: Physician Assistant

## 2017-07-30 ENCOUNTER — Encounter: Payer: Self-pay | Admitting: Physician Assistant

## 2017-09-21 DIAGNOSIS — F422 Mixed obsessional thoughts and acts: Secondary | ICD-10-CM | POA: Diagnosis not present

## 2017-09-21 DIAGNOSIS — F900 Attention-deficit hyperactivity disorder, predominantly inattentive type: Secondary | ICD-10-CM | POA: Diagnosis not present

## 2017-11-27 ENCOUNTER — Other Ambulatory Visit: Payer: Self-pay

## 2017-11-27 ENCOUNTER — Emergency Department (HOSPITAL_COMMUNITY)
Admission: EM | Admit: 2017-11-27 | Discharge: 2017-11-28 | Disposition: A | Discharge status: Home / Self Care | Payer: BLUE CROSS/BLUE SHIELD | Attending: Emergency Medicine | Admitting: Emergency Medicine

## 2017-11-27 ENCOUNTER — Encounter (HOSPITAL_COMMUNITY): Payer: Self-pay

## 2017-11-27 DIAGNOSIS — R109 Unspecified abdominal pain: Secondary | ICD-10-CM | POA: Insufficient documentation

## 2017-11-27 DIAGNOSIS — Z79899 Other long term (current) drug therapy: Secondary | ICD-10-CM | POA: Insufficient documentation

## 2017-11-27 LAB — URINALYSIS, ROUTINE W REFLEX MICROSCOPIC
Bacteria, UA: NONE SEEN
Bilirubin Urine: NEGATIVE
GLUCOSE, UA: NEGATIVE mg/dL
Ketones, ur: NEGATIVE mg/dL
Leukocytes, UA: NEGATIVE
Nitrite: NEGATIVE
Protein, ur: NEGATIVE mg/dL
Specific Gravity, Urine: 1.021 (ref 1.005–1.030)
pH: 6 (ref 5.0–8.0)

## 2017-11-27 LAB — I-STAT BETA HCG BLOOD, ED (MC, WL, AP ONLY): I-stat hCG, quantitative: <5 m[IU]/mL (ref <5)

## 2017-11-27 MED ORDER — KETOROLAC TROMETHAMINE 30 MG/ML IJ SOLN
30.0000 mg | Freq: Once | INTRAMUSCULAR | Status: AC
  Administered 2017-11-27: 30 mg via INTRAVENOUS
  Filled 2017-11-27: qty 1

## 2017-11-27 NOTE — ED Triage Notes (Signed)
Pt presents to ED from home for R flank pain. Pt has hx of kidney stones. Pt reports pain started x3 days ago.

## 2017-11-28 MED ORDER — TAMSULOSIN HCL 0.4 MG PO CAPS: 0.4000 mg | ORAL_CAPSULE | Freq: Every day | ORAL | 0 refills | Status: AC

## 2017-11-28 MED ORDER — ONDANSETRON HCL 4 MG/2ML IJ SOLN
4.0000 mg | Freq: Once | INTRAMUSCULAR | Status: AC
Start: 2017-11-28 — End: 2017-11-28
  Administered 2017-11-28: 4 mg via INTRAVENOUS
  Filled 2017-11-28: qty 2

## 2017-11-28 MED ORDER — HYDROMORPHONE HCL 1 MG/ML IJ SOLN
1.0000 mg | Freq: Once | INTRAMUSCULAR | Status: AC
  Administered 2017-11-28: 1 mg via INTRAVENOUS
  Filled 2017-11-28: qty 1

## 2017-11-28 MED ORDER — OXYCODONE-ACETAMINOPHEN 5-325 MG PO TABS: 1.0000 | ORAL_TABLET | ORAL | 0 refills | Status: AC | PRN

## 2017-11-28 MED ORDER — ONDANSETRON HCL 4 MG PO TABS: 4.0000 mg | ORAL_TABLET | Freq: Four times a day (QID) | ORAL | 0 refills | Status: AC

## 2017-11-28 NOTE — ED Provider Notes (Signed)
[facility]-EMERGENCY DEPT Provider Note   CSN: [id number] Arrival date & time: 11/27/17  2228     History   Chief Complaint Chief Complaint  Patient presents with  . Flank Pain  . Nephrolithiasis    HPI Julie Mills is a 37 y.o. female.  Patient presents to the emergency department for evaluation of right flank pain.  Patient reports a history of recurrent kidney stones with similar pain.  She says that she had some slight pains on and off yesterday but tonight the pain became constant and severe.     Past Medical History:  Diagnosis Date  . ADHD   . Anxiety   . Depression   . Dysuria   . Frequency of urination   . Headache   . Hematuria   . History of kidney stones   . Left ureteral stone   . Urgency of urination     There are no active problems to display for this patient.   Past Surgical History:  Procedure Laterality Date  . BREAST ENHANCEMENT SURGERY Bilateral 2016  . EXCISION FORGEIN BODY LEFT FOREARM  11/07/2004  . URETEROSCOPY WITH HOLMIUM LASER LITHOTRIPSY Left 02/05/2017   Procedure: CYSTOSCOPY, RETROGRADE / PYELOGRAM, URETEROSCOPY WITH STONE BASKETRY / STENT PLACEMENT;  Surgeon: Crist FatHerrick, Benjamin W, MD;  [facility];  Service: Urology;  Laterality: Left;  NEEDS 60 MIN FOR PROCEDURE     OB History    Gravida  1   Para      Term      Preterm      AB      Living        SAB      TAB      Ectopic      Multiple      Live Births               Home Medications    Prior to Admission medications   Medication Sig Start Date End Date Taking? Authorizing Provider  ADDERALL XR 30 MG 24 hr capsule Take 30 mg by mouth daily as needed (for ADHD).  12/30/16  Yes [provider]  ALPRAZolam Prudy Feeler(XANAX) 0.5 MG tablet Take 0.5 mg by mouth 3 (three) times daily as needed for anxiety. 01/16/17  Yes [provider]  buPROPion (WELLBUTRIN XL) 150 MG 24 hr tablet Take 150 mg by mouth  every morning.    Yes [provider]  fluvoxaMINE (LUVOX) 100 MG tablet Take 100 mg by mouth daily after breakfast. 11/25/17  Yes [provider]  ibuprofen (ADVIL,MOTRIN) 200 MG tablet Take 600 mg by mouth every 6 (six) hours as needed for fever, headache, mild pain, moderate pain or cramping.    Yes [provider]  Levonorgestrel-Ethinyl Estradiol (SEASONIQUE) 0.15-0.03 &0.01 MG tablet Take 1 tablet by mouth every morning.    Yes [provider]  ondansetron (ZOFRAN) 4 MG tablet Take 1 tablet (4 mg total) by mouth every 6 (six) hours. 11/28/17   Gilda CreasePollina, Christopher J, MD  oxyCODONE-acetaminophen (PERCOCET) 5-325 MG tablet Take 1-2 tablets by mouth every 4 (four) hours as needed for moderate pain or severe pain. 11/28/17   Gilda CreasePollina, Christopher J, MD  tamsulosin (FLOMAX) 0.4 MG CAPS capsule Take 1 capsule (0.4 mg total) by mouth daily. 11/28/17   Gilda CreasePollina, Christopher J, MD    Family History Family History  Problem Relation Age of Onset  . Hyperlipidemia Father   . Cancer Maternal Grandmother   .  Diabetes Maternal Grandmother   . Heart disease Maternal Grandmother   . Cancer Maternal Grandfather   . Diabetes Maternal Grandfather   . Heart disease Maternal Grandfather   . Stroke Maternal Grandfather     Social History Social History   Tobacco Use  . Smoking status: Never Smoker  . Smokeless tobacco: Never Used  Substance Use Topics  . Alcohol use: No  . Drug use: No     Allergies   Patient has no known allergies.   Review of Systems Review of Systems  Genitourinary: Positive for flank pain.  All other systems reviewed and are negative.    Physical Exam Updated Vital Signs BP (!) 154/109 (BP Location: Left Arm) Comment: RN aware  Pulse 91   Temp 98.4 F (36.9 C) (Oral)   Resp 18   Ht 5\' 7"  (1.702 m)   Wt 65.8 kg   SpO2 100%   BMI 22.71 kg/m   Physical Exam  Constitutional: She is oriented to person, place, and time. She  appears well-developed and well-nourished. No distress.  HENT:  Head: Normocephalic and atraumatic.  Right Ear: Hearing normal.  Left Ear: Hearing normal.  Nose: Nose normal.  Mouth/Throat: Oropharynx is clear and moist and mucous membranes are normal.  Eyes: Pupils are equal, round, and reactive to light. Conjunctivae and EOM are normal.  Neck: Normal range of motion. Neck supple.  Cardiovascular: Regular rhythm, S1 normal and S2 normal. Exam reveals no gallop and no friction rub.  No murmur heard. Pulmonary/Chest: Effort normal and breath sounds normal. No respiratory distress. She exhibits no tenderness.  Abdominal: Soft. Normal appearance and bowel sounds are normal. There is no hepatosplenomegaly. There is no tenderness. There is no rebound, no guarding, no tenderness at McBurney's point and negative Murphy's sign. No hernia.  Musculoskeletal: Normal range of motion.  Neurological: She is alert and oriented to person, place, and time. She has normal strength. No cranial nerve deficit or sensory deficit. Coordination normal. GCS eye subscore is 4. GCS verbal subscore is 5. GCS motor subscore is 6.  Skin: Skin is warm, dry and intact. No rash noted. No cyanosis.  Psychiatric: She has a normal mood and affect. Her speech is normal and behavior is normal. Thought content normal.  Nursing note and vitals reviewed.    ED Treatments / Results  Labs (all labs ordered are listed, but only abnormal results are displayed) Labs Reviewed  URINALYSIS, ROUTINE W REFLEX MICROSCOPIC - Abnormal; Notable for the following components:      Result Value   Hgb urine dipstick SMALL (*)    All other components within normal limits  I-STAT BETA HCG BLOOD, ED ([facility], [facility], [facility] ONLY)  I-STAT CHEM 8, ED    EKG None  Radiology No results found.  Procedures Procedures (including critical care time)  Medications Ordered in ED Medications  ketorolac (TORADOL) 30 MG/ML injection 30 mg (30 mg Intravenous  Given 11/27/17 2302)  HYDROmorphone (DILAUDID) injection 1 mg (1 mg Intravenous Given 11/28/17 0028)  ondansetron (ZOFRAN) injection 4 mg (4 mg Intravenous Given 11/28/17 0028)     Initial Impression / Assessment and Plan / ED Course  I have reviewed the triage vital signs and the nursing notes.  Pertinent labs & imaging results that were available during my care of the patient were reviewed by me and considered in my medical decision making (see chart for details).     Patient presents with right-sided flank pain which she feels certain is related  to kidney stones.  She has had recurrent kidney stones in the past with identical symptoms.  Pain started several days ago intermittently but has been continuous tonight.  She does have microscopic hematuria without signs of infection.  Discussed with her risks of imaging tonight, patient is comfortable with analgesia and follow-up with urology.  She can return to the ER if she has uncontrolled pain or if she develops any fever.  Final Clinical Impressions(s) / ED Diagnoses   Final diagnoses:  Flank pain    ED Discharge Orders         Ordered    tamsulosin (FLOMAX) 0.4 MG CAPS capsule  Daily     11/28/17 0059    oxyCODONE-acetaminophen (PERCOCET) 5-325 MG tablet  Every 4 hours PRN     11/28/17 0059    ondansetron (ZOFRAN) 4 MG tablet  Every 6 hours     11/28/17 0059           Gilda CreasePollina, Christopher J, MD 11/28/17 0100

## 2018-03-15 DIAGNOSIS — F900 Attention-deficit hyperactivity disorder, predominantly inattentive type: Secondary | ICD-10-CM | POA: Diagnosis not present

## 2018-03-15 DIAGNOSIS — F422 Mixed obsessional thoughts and acts: Secondary | ICD-10-CM | POA: Diagnosis not present

## 2018-04-08 DIAGNOSIS — J019 Acute sinusitis, unspecified: Secondary | ICD-10-CM | POA: Diagnosis not present

## 2018-07-07 DIAGNOSIS — Z0000 Encounter for general adult medical examination without abnormal findings: Secondary | ICD-10-CM | POA: Diagnosis not present

## 2018-07-15 ENCOUNTER — Encounter (HOSPITAL_COMMUNITY): Payer: Self-pay | Admitting: Emergency Medicine

## 2018-07-15 ENCOUNTER — Other Ambulatory Visit: Payer: Self-pay

## 2018-07-15 ENCOUNTER — Emergency Department (HOSPITAL_COMMUNITY)
Admission: EM | Admit: 2018-07-15 | Discharge: 2018-07-15 | Disposition: A | Discharge status: Home / Self Care | Payer: BLUE CROSS/BLUE SHIELD | Attending: Emergency Medicine | Admitting: Emergency Medicine

## 2018-07-15 ENCOUNTER — Emergency Department (HOSPITAL_COMMUNITY): Payer: BLUE CROSS/BLUE SHIELD

## 2018-07-15 DIAGNOSIS — R1031 Right lower quadrant pain: Secondary | ICD-10-CM | POA: Insufficient documentation

## 2018-07-15 DIAGNOSIS — R1011 Right upper quadrant pain: Secondary | ICD-10-CM | POA: Diagnosis not present

## 2018-07-15 DIAGNOSIS — N200 Calculus of kidney: Secondary | ICD-10-CM | POA: Insufficient documentation

## 2018-07-15 DIAGNOSIS — R1030 Lower abdominal pain, unspecified: Secondary | ICD-10-CM | POA: Diagnosis not present

## 2018-07-15 DIAGNOSIS — Z79899 Other long term (current) drug therapy: Secondary | ICD-10-CM | POA: Diagnosis not present

## 2018-07-15 DIAGNOSIS — R109 Unspecified abdominal pain: Secondary | ICD-10-CM

## 2018-07-15 LAB — URINALYSIS, ROUTINE W REFLEX MICROSCOPIC
Bilirubin Urine: NEGATIVE
Glucose, UA: NEGATIVE mg/dL
Ketones, ur: NEGATIVE mg/dL
Leukocytes,Ua: NEGATIVE
Nitrite: NEGATIVE
Protein, ur: NEGATIVE mg/dL
Specific Gravity, Urine: 1.017 (ref 1.005–1.030)
pH: 6 (ref 5.0–8.0)

## 2018-07-15 LAB — COMPREHENSIVE METABOLIC PANEL
ALT: 9 U/L (ref 0–44)
AST: 16 U/L (ref 15–41)
Albumin: 3.8 g/dL (ref 3.5–5.0)
Alkaline Phosphatase: 86 U/L (ref 38–126)
Anion gap: 5 (ref 5–15)
BUN: 14 mg/dL (ref 6–20)
CO2: 26 mmol/L (ref 22–32)
Calcium: 8.7 mg/dL — ABNORMAL LOW (ref 8.9–10.3)
Chloride: 109 mmol/L (ref 98–111)
Creatinine, Ser: 0.86 mg/dL (ref 0.44–1.00)
GFR calc Af Amer: >60 mL/min (ref >60)
GFR calc non Af Amer: >60 mL/min (ref >60)
Glucose, Bld: 102 mg/dL — ABNORMAL HIGH (ref 70–99)
Potassium: 3.4 mmol/L — ABNORMAL LOW (ref 3.5–5.1)
Sodium: 140 mmol/L (ref 135–145)
Total Bilirubin: 0.5 mg/dL (ref 0.3–1.2)
Total Protein: 7.2 g/dL (ref 6.5–8.1)

## 2018-07-15 LAB — CBC
HCT: 46 % (ref 36.0–46.0)
Hemoglobin: 15 g/dL (ref 12.0–15.0)
MCH: 30.5 pg (ref 26.0–34.0)
MCHC: 32.6 g/dL (ref 30.0–36.0)
MCV: 93.7 fL (ref 80.0–100.0)
Platelets: 280 10*3/uL (ref 150–400)
RBC: 4.91 MIL/uL (ref 3.87–5.11)
RDW: 12.1 % (ref 11.5–15.5)
WBC: 6.4 10*3/uL (ref 4.0–10.5)
nRBC: 0 % (ref 0.0–0.2)

## 2018-07-15 LAB — I-STAT BETA HCG BLOOD, ED (MC, WL, AP ONLY): I-stat hCG, quantitative: <5 m[IU]/mL (ref <5)

## 2018-07-15 LAB — LIPASE, BLOOD: Lipase: 29 U/L (ref 11–51)

## 2018-07-15 MED ORDER — KETOROLAC TROMETHAMINE 30 MG/ML IJ SOLN
30.0000 mg | Freq: Once | INTRAMUSCULAR | Status: AC
Start: 2018-07-15 — End: 2018-07-15
  Administered 2018-07-15: 30 mg via INTRAVENOUS
  Filled 2018-07-15: qty 1

## 2018-07-15 MED ORDER — NAPROXEN 375 MG PO TABS: 375.0000 mg | ORAL_TABLET | Freq: Two times a day (BID) | ORAL | 0 refills | Status: AC

## 2018-07-15 MED ORDER — HYDROMORPHONE HCL 1 MG/ML IJ SOLN
1.0000 mg | Freq: Once | INTRAMUSCULAR | Status: AC
  Administered 2018-07-15: 1 mg via INTRAVENOUS
  Filled 2018-07-15: qty 1

## 2018-07-15 MED ORDER — ONDANSETRON HCL 4 MG PO TABS: 4.0000 mg | ORAL_TABLET | Freq: Four times a day (QID) | ORAL | 0 refills | Status: AC | PRN

## 2018-07-15 MED ORDER — ONDANSETRON HCL 4 MG/2ML IJ SOLN
4.0000 mg | INTRAMUSCULAR | Status: AC
  Administered 2018-07-15: 4 mg via INTRAVENOUS
  Filled 2018-07-15: qty 2

## 2018-07-15 MED ORDER — SODIUM CHLORIDE 0.9% FLUSH
3.0000 mL | Freq: Once | INTRAVENOUS | Status: AC
  Administered 2018-07-15: 3 mL via INTRAVENOUS

## 2018-07-15 NOTE — ED Notes (Signed)
Patient transported to CT 

## 2018-07-15 NOTE — ED Notes (Signed)
PT DISCHARGED. INSTRUCTIONS AND PRESCRIPTIONS GIVEN. AAOX4. PT IN NO APPARENT DISTRESS OR PAIN. THE OPPORTUNITY TO ASK QUESTIONS WAS PROVIDED. 

## 2018-07-15 NOTE — Discharge Instructions (Signed)
Contact a health care provider if: You have a fever or chills. Your urine smells bad or looks cloudy. You have pain or burning when you pass urine. Get help right away if: Your flank pain or groin pain suddenly worsens. You become confused or disoriented or you lose consciousness. 

## 2018-07-15 NOTE — ED Triage Notes (Signed)
Patient is complaining of upper right abdominal pain that is radiating from the her right lower back. Patient has a hx of kidney stones. Patient last had kidney stones last year.

## 2018-07-15 NOTE — ED Provider Notes (Signed)
[facility]-EMERG[facility] DEPT Provider Note   CSN: [id number] Arrival date & time: 07/15/18  4707    History   Chief Complaint Chief Complaint  Patient presents with  . Abdominal Pain    HPI Julie Mills is a 38 y.o. female who presents with cc of R flank pain. The patient had sudden onset of flank pain in the right side radiating into her groin starting around 1 PM.  She states the pain is sharp, colicky.  She has associated nausea.  She has a history of kidney stones states that this feels the same.  She denies dysuria, fevers, chills, vomiting, urinary symptoms or vaginal symptoms.     HPI  Past Medical History:  Diagnosis Date  . ADHD   . Anxiety   . Depression   . Dysuria   . Frequency of urination   . Headache   . Hematuria   . History of kidney stones   . Left ureteral stone   . Urgency of urination     There are no active problems to display for this patient.   Past Surgical History:  Procedure Laterality Date  . BREAST ENHANCEMENT SURGERY Bilateral 2016  . EXCISION FORGEIN BODY LEFT FOREARM  11/07/2004  . URETEROSCOPY WITH HOLMIUM LASER LITHOTRIPSY Left 02/05/2017   Procedure: CYSTOSCOPY, RETROGRADE / PYELOGRAM, URETEROSCOPY WITH STONE BASKETRY / STENT PLACEMENT;  Surgeon: Crist Fat, MD;  [facility];  Service: Urology;  Laterality: Left;  NEEDS 60 MIN FOR PROCEDURE     OB History    Gravida  1   Para      Term      Preterm      AB      Living        SAB      TAB      Ectopic      Multiple      Live Births               Home Medications    Prior to Admission medications   Medication Sig Start Date End Date Taking? Authorizing Provider  ALPRAZolam Prudy Feeler) 0.5 MG tablet Take 0.5 mg by mouth 3 (three) times daily as needed for anxiety. 01/16/17  Yes [provider]  Biotin 1 MG CAPS Take 1 capsule by mouth daily.   Yes [provider]  buPROPion (WELLBUTRIN  XL) 150 MG 24 hr tablet Take 150 mg by mouth every morning.    Yes [provider]  ibuprofen (ADVIL,MOTRIN) 200 MG tablet Take 600 mg by mouth every 6 (six) hours as needed for fever, headache, mild pain, moderate pain or cramping.    Yes [provider]  Levonorgestrel-Ethinyl Estradiol (SEASONIQUE) 0.15-0.03 &0.01 MG tablet Take 1 tablet by mouth every morning.    Yes [provider]  ADDERALL XR 30 MG 24 hr capsule Take 30 mg by mouth daily as needed (for ADHD).  12/30/16   [provider]  ondansetron (ZOFRAN) 4 MG tablet Take 1 tablet (4 mg total) by mouth every 6 (six) hours. Patient not taking: Reported on 07/15/2018 11/28/17   Gilda Crease, MD  oxyCODONE-acetaminophen (PERCOCET) 5-325 MG tablet Take 1-2 tablets by mouth every 4 (four) hours as needed for moderate pain or severe pain. Patient not taking: Reported on 07/15/2018 11/28/17   Gilda Crease, MD  tamsulosin (FLOMAX) 0.4 MG CAPS capsule Take 1 capsule (0.4 mg total) by mouth daily. Patient not taking: Reported  on 07/15/2018 11/28/17   Gilda Crease, MD    Family History Family History  Problem Relation Age of Onset  . Hyperlipidemia Father   . Cancer Maternal Grandmother   . Diabetes Maternal Grandmother   . Heart disease Maternal Grandmother   . Cancer Maternal Grandfather   . Diabetes Maternal Grandfather   . Heart disease Maternal Grandfather   . Stroke Maternal Grandfather     Social History Social History   Tobacco Use  . Smoking status: Never Smoker  . Smokeless tobacco: Never Used  Substance Use Topics  . Alcohol use: No  . Drug use: No     Allergies   Patient has no known allergies.   Review of Systems Review of Systems  Ten systems reviewed and are negative for acute change, except as noted in the HPI.   Physical Exam Updated Vital Signs BP (!) 168/115 (BP Location: Left Arm)   Pulse (!) 54   Temp 97.8 F (36.6 C) (Oral)   Resp 16   Ht   (1.702 m)   Wt 68 kg   SpO2 99%   BMI 23.49 kg/m   Physical Exam Vitals signs and nursing note reviewed.  Constitutional:      General: She is not in acute distress.    Appearance: She is well-developed. She is not diaphoretic.  HENT:     Head: Normocephalic and atraumatic.  Eyes:     General: No scleral icterus.    Conjunctiva/sclera: Conjunctivae normal.  Neck:     Musculoskeletal: Normal range of motion.  Cardiovascular:     Rate and Rhythm: Normal rate and regular rhythm.     Heart sounds: Normal heart sounds. No murmur. No friction rub. No gallop.   Pulmonary:     Effort: Pulmonary effort is normal. No respiratory distress.     Breath sounds: Normal breath sounds.  Abdominal:     General: Bowel sounds are normal. There is no distension.     Palpations: Abdomen is soft. There is no mass.     Tenderness: There is no abdominal tenderness. There is right CVA tenderness. There is no guarding.  Skin:    General: Skin is warm and dry.  Neurological:     Mental Status: She is alert and oriented to person, place, and time.  Psychiatric:        Behavior: Behavior normal.      ED Treatments / Results  Labs (all labs ordered are listed, but only abnormal results are displayed) Labs Reviewed  CBC  LIPASE, BLOOD  COMPREHENSIVE METABOLIC PANEL  URINALYSIS, ROUTINE W REFLEX MICROSCOPIC  I-STAT BETA HCG BLOOD, ED ([facility], [facility], [facility] ONLY)    EKG None  Radiology No results found.  Procedures Procedures (including critical care time)  Medications Ordered in ED Medications  sodium chloride flush (NS) 0.9 % injection 3 mL (has no administration in time range)     Initial Impression / Assessment and Plan / ED Course  I have reviewed the triage vital signs and the nursing notes.  Pertinent labs & imaging results that were available during my care of the patient were reviewed by me and considered in my medical decision making (see chart for details).        Patient  with flank pain. The differential diagnosis of emergent flank pain includes, but is not limited to :Abdominal aortic aneurysm,, Renal artery embolism,Renal vein thrombosis, Aortic dissection, Mesenteric ischemia, Pyelonephritis, Renal infarction, Renal hemorrhage, Nephrolithiasis/ Renal Colic, Bladder tumor,Cystitis,  Biliary colic, Pancreatitis Perforated peptic ulcer Appendicitis ,Inguinal Hernia, Diverticulitis, Bowel obstruction Ectopic Pregnancy,PID/TOA,Ovarian cyst, Ovarian torsion, Shingles Lower lobe pneumonia, Retroperitoneal hematoma/abscess/tumor, Epidural abscess, Epidural hematoma  History of kidney pain.  Resolved normal hemoglobin in her urine.  No stone is seen on the exam.  It is possible that the patient has passed a stone already.  Hemoglobin in the urine would certainly fit with this.  She states she is not menstruating.  She does not have signs or symptoms of pyelonephritis.  Patient is currently pain-free.  She has no abdominal tenderness or pelvic tenderness on examination.  I have very low suspicion for ovarian torsion or other intra-abdominal infectious process.  The patient is currently pain-free and feels appropriate to go home.  I discussed return precautions.  Patient appears reliable.  She has close outpatient follow-up with urology.  Final Clinical Impressions(s) / ED Diagnoses   Final diagnoses:  None    ED Discharge Orders    None       Arthor Captain, PA-C 07/18/18 2209    Jacalyn Lefevre, MD 07/27/18 1058

## 2018-09-24 DIAGNOSIS — F900 Attention-deficit hyperactivity disorder, predominantly inattentive type: Secondary | ICD-10-CM | POA: Diagnosis not present

## 2018-09-24 DIAGNOSIS — F410 Panic disorder [episodic paroxysmal anxiety] without agoraphobia: Secondary | ICD-10-CM | POA: Diagnosis not present

## 2018-09-24 DIAGNOSIS — F422 Mixed obsessional thoughts and acts: Secondary | ICD-10-CM | POA: Diagnosis not present

## 2019-03-20 DIAGNOSIS — F422 Mixed obsessional thoughts and acts: Secondary | ICD-10-CM | POA: Diagnosis not present

## 2019-03-20 DIAGNOSIS — F900 Attention-deficit hyperactivity disorder, predominantly inattentive type: Secondary | ICD-10-CM | POA: Diagnosis not present

## 2019-07-03 IMAGING — CT CT RENAL STONE PROTOCOL
2 of 4 series · 17 of 46 positions shown, 19 images · non-contrast
Comparison: None.

CLINICAL DATA: Right upper quadrant pain radiating to the right
lower back

EXAM:
CT ABDOMEN AND PELVIS WITHOUT CONTRAST
TECHNIQUE: Multidetector CT imaging of the abdomen and pelvis was performed
following the standard protocol without IV contrast.

[Series 3: axial st · axial · 0.80mm/px · z∈[+1096,+1521]mm · 14 of 96 slices shown, 16 images]
[im 6/96  soft-tissue]
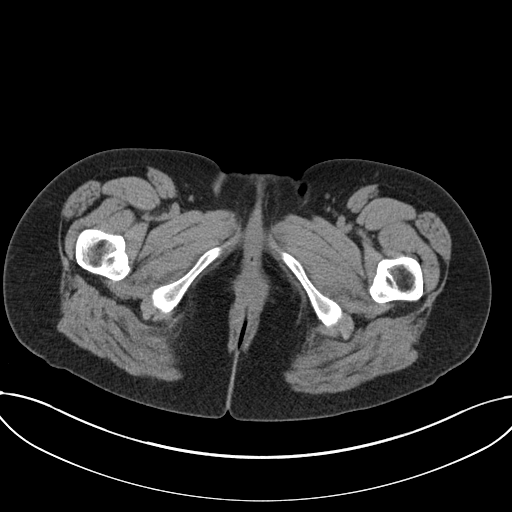
[im 6/96  bone]
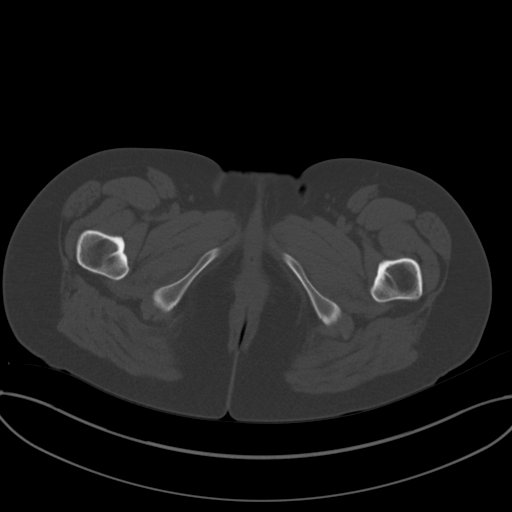
[im 11/96  soft-tissue]
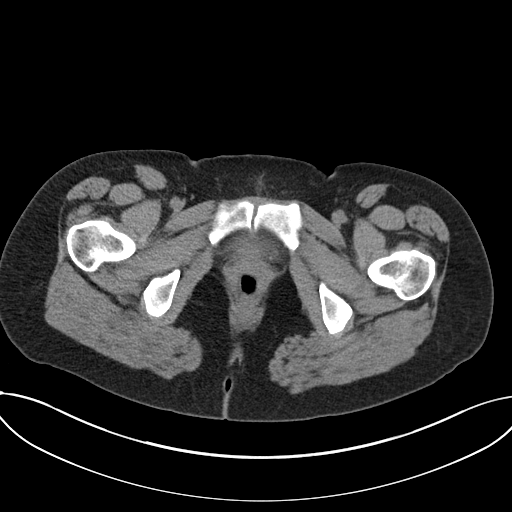
[im 21/96  soft-tissue]
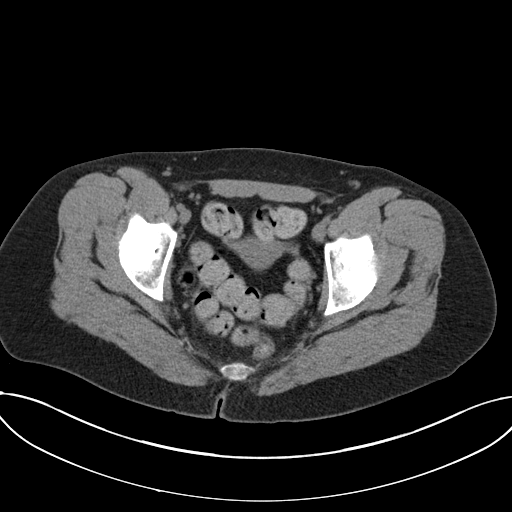
[im 26/96  soft-tissue]
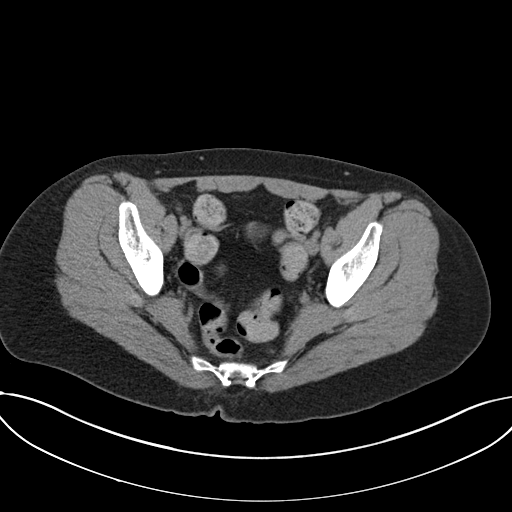
[im 31/96  soft-tissue]
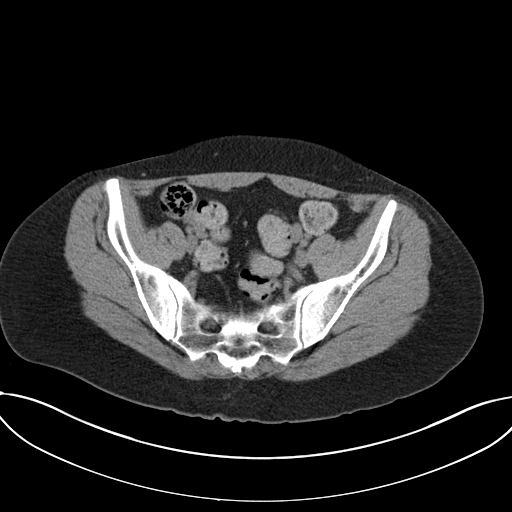
[im 41/96  soft-tissue]
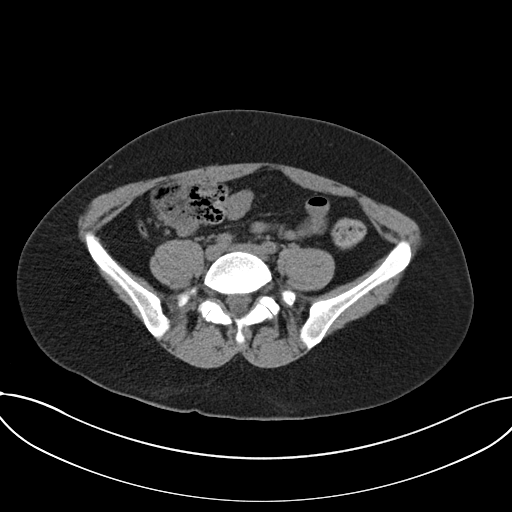
[im 46/96  soft-tissue]
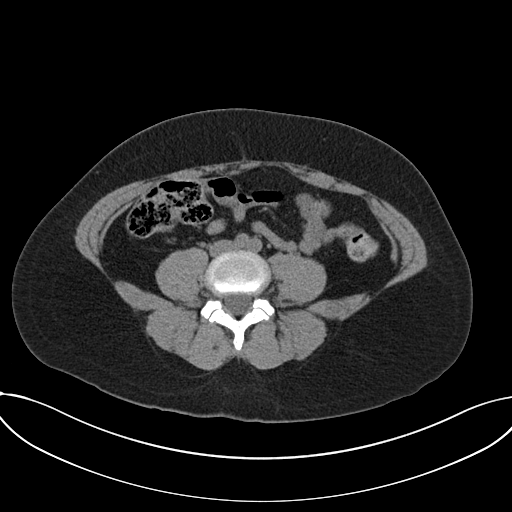
[im 51/96  soft-tissue]
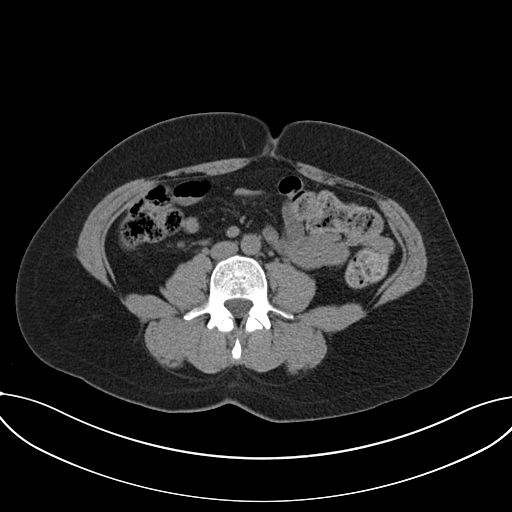
[im 56/96  soft-tissue]
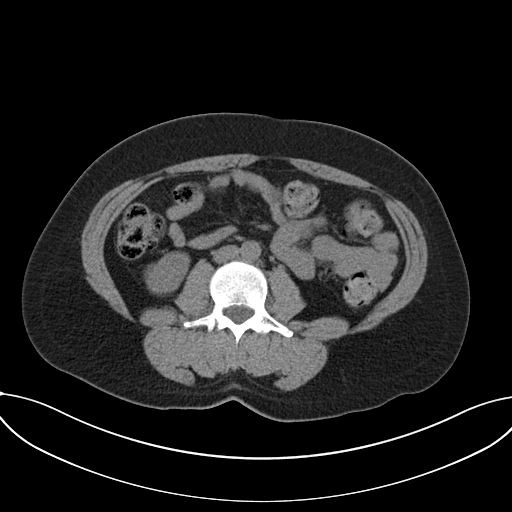
[im 56/96  bone]
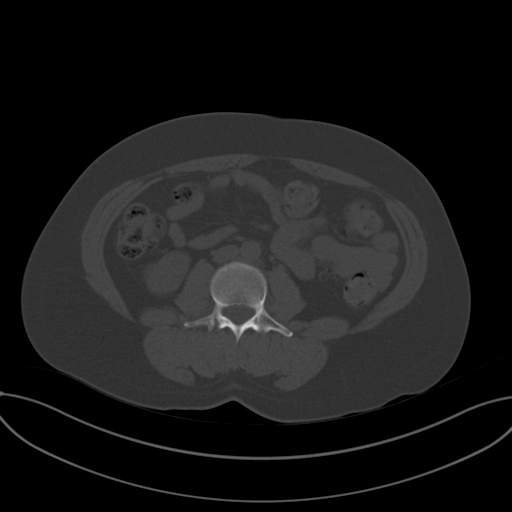
[im 66/96  soft-tissue]
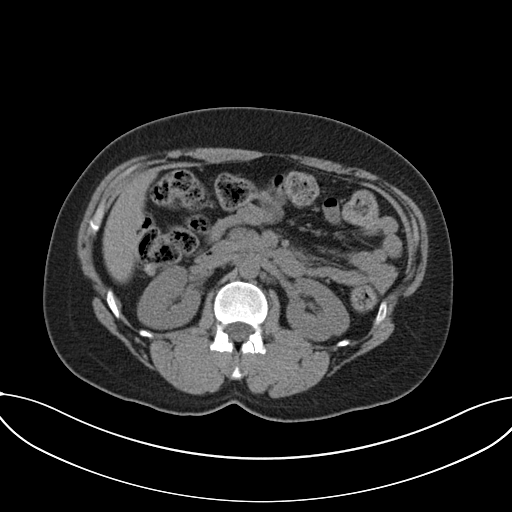
[im 71/96  soft-tissue]
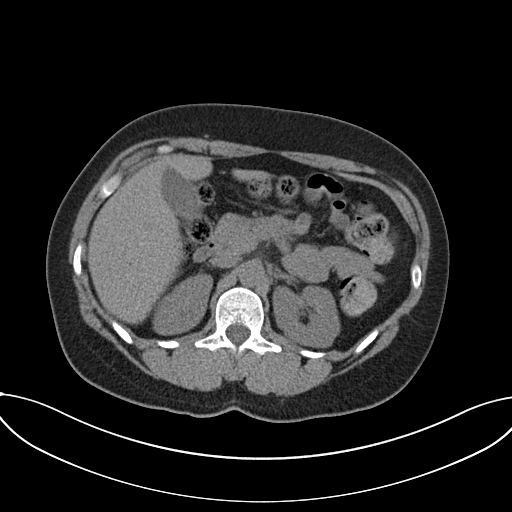
[im 76/96  soft-tissue]
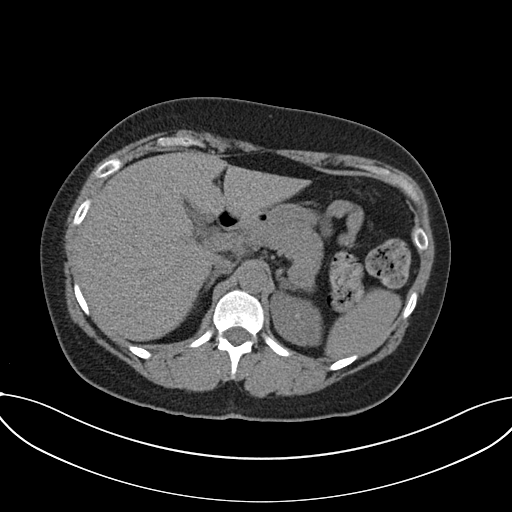
[im 86/96  soft-tissue]
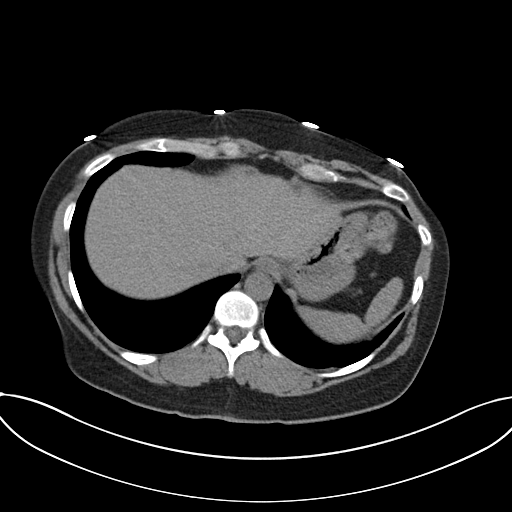
[im 91/96  soft-tissue]
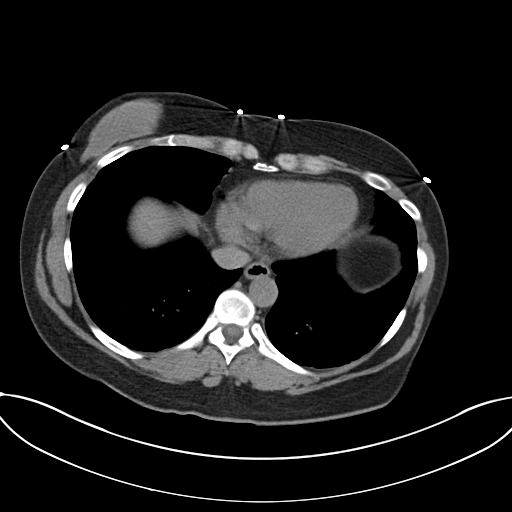

[Series 6: coronal · coronal · 0.80mm/px · 3 of 130 slices shown]
[im 44/130  soft-tissue]
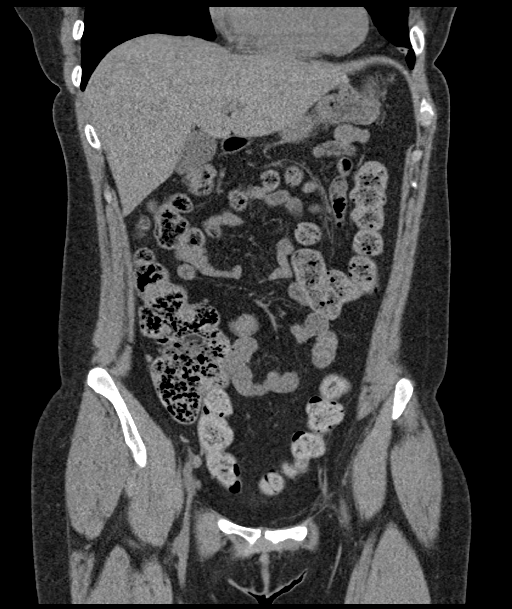
[im 58/130  soft-tissue]
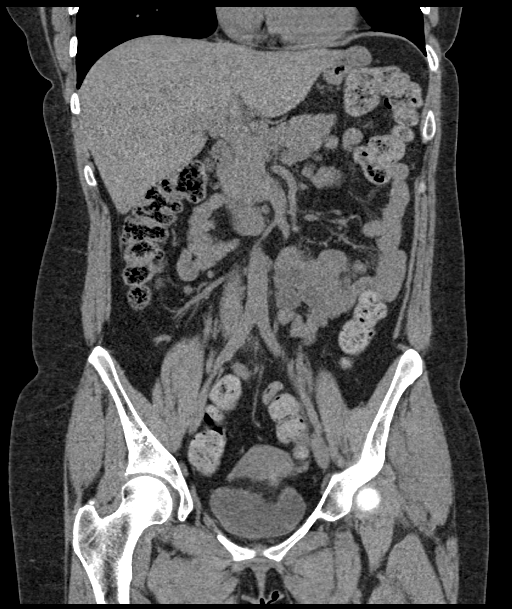
[im 72/130  soft-tissue]
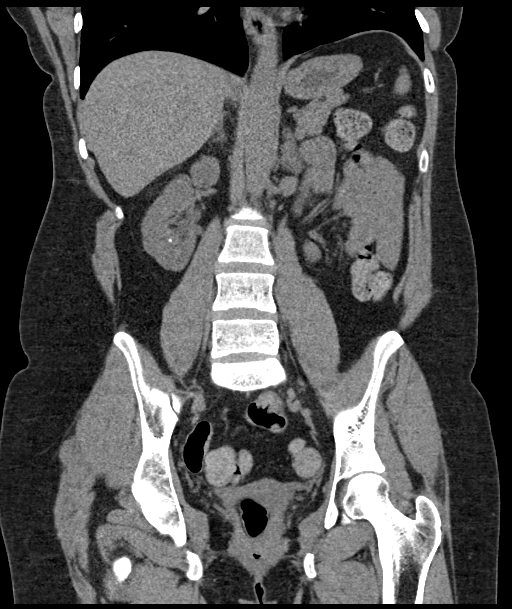

[17 of 46 positions shown; findings below may reference images not displayed]

FINDINGS: Lower chest: No acute abnormality.

Hepatobiliary: No focal liver abnormality is seen. No gallstones,
gallbladder wall thickening, or biliary dilatation.

Pancreas: Unremarkable. No pancreatic ductal dilatation or
surrounding inflammatory changes.

Spleen: Normal in size without focal abnormality.

Adrenals/Urinary Tract: Normal adrenal glands. No obstructive
uropathy. Bilateral nonobstructing renal calculi. No renal mass.
Normal bladder.

Stomach/Bowel: Stomach is within normal limits. Appendix appears
normal. No evidence of bowel wall thickening, distention, or
inflammatory changes.

Vascular/Lymphatic: No significant vascular findings are present. No
enlarged abdominal or pelvic lymph nodes.

Reproductive: Uterus and bilateral adnexa are unremarkable.

Other: No abdominal wall hernia or abnormality. No abdominopelvic
ascites.

Musculoskeletal: No acute osseous abnormality. No aggressive osseous
lesion.
IMPRESSION: 1. Nonobstructing right renal calculi.
2. Normal appendix.

## 2019-09-27 DIAGNOSIS — F4322 Adjustment disorder with anxiety: Secondary | ICD-10-CM | POA: Diagnosis not present

## 2019-09-27 DIAGNOSIS — F900 Attention-deficit hyperactivity disorder, predominantly inattentive type: Secondary | ICD-10-CM | POA: Diagnosis not present

## 2019-09-27 DIAGNOSIS — F410 Panic disorder [episodic paroxysmal anxiety] without agoraphobia: Secondary | ICD-10-CM | POA: Diagnosis not present

## 2019-11-07 DIAGNOSIS — L719 Rosacea, unspecified: Secondary | ICD-10-CM | POA: Diagnosis not present

## 2019-11-29 DIAGNOSIS — L700 Acne vulgaris: Secondary | ICD-10-CM | POA: Diagnosis not present

## 2020-03-21 DIAGNOSIS — F422 Mixed obsessional thoughts and acts: Secondary | ICD-10-CM | POA: Diagnosis not present

## 2020-03-21 DIAGNOSIS — F3342 Major depressive disorder, recurrent, in full remission: Secondary | ICD-10-CM | POA: Diagnosis not present

## 2020-03-21 DIAGNOSIS — F900 Attention-deficit hyperactivity disorder, predominantly inattentive type: Secondary | ICD-10-CM | POA: Diagnosis not present

## 2020-10-12 ENCOUNTER — Emergency Department (HOSPITAL_COMMUNITY)
Admission: EM | Admit: 2020-10-12 | Discharge: 2020-10-12 | Disposition: A | Discharge status: Home / Self Care | Payer: BC Managed Care – PPO | Attending: Emergency Medicine | Admitting: Emergency Medicine

## 2020-10-12 ENCOUNTER — Other Ambulatory Visit: Payer: Self-pay

## 2020-10-12 DIAGNOSIS — Z760 Encounter for issue of repeat prescription: Secondary | ICD-10-CM | POA: Diagnosis not present

## 2020-10-12 MED ORDER — ADDERALL XR 30 MG PO CP24: 30.0000 mg | ORAL_CAPSULE | Freq: Every day | ORAL | 0 refills | Status: AC | PRN

## 2020-10-12 NOTE — ED Provider Notes (Addendum)
[facility]-EMERGENCY DEPT Provider Note   CSN: [id number] Arrival date & time: 10/12/20  1244     History Chief Complaint  Patient presents with   Medication Refill    Julie Mills is a 40 y.o. female.  Julie Mills is a 40 y.o. female with a history of ADHD, anxiety, depression, headaches, kidney stones, who presents to the emergency department for medication refill.  Patient reports she is in need of a refill of her Adderall.  She reports she forgot to call it in 2 days ago and now it is a holiday weekend and her doctor's office is closed.  She tried calling the after-hours number, and they directed her to come to the emergency department.  She took her last dose of Adderall yesterday morning and reports she usually gets severe withdrawal symptoms if she misses this medication.  Her doctor will not be back in the office until Tuesday.  No other complaints.  The history is provided by the patient.      Past Medical History:  Diagnosis Date   ADHD    Anxiety    Depression    Dysuria    Frequency of urination    Headache    Hematuria    History of kidney stones    Left ureteral stone    Urgency of urination     There are no problems to display for this patient.   Past Surgical History:  Procedure Laterality Date   BREAST ENHANCEMENT SURGERY Bilateral 2016   EXCISION FORGEIN BODY LEFT FOREARM  11/07/2004   URETEROSCOPY WITH HOLMIUM LASER LITHOTRIPSY Left 02/05/2017   Procedure: CYSTOSCOPY, RETROGRADE / PYELOGRAM, URETEROSCOPY WITH STONE BASKETRY / STENT PLACEMENT;  Surgeon: Crist Fat, MD;  Location: [facility];  Service: Urology;  Laterality: Left;  NEEDS 60 MIN FOR PROCEDURE     OB History     Gravida  1   Para      Term      Preterm      AB      Living         SAB      IAB      Ectopic      Multiple      Live Births              Family History  Problem Relation Age of Onset    Hyperlipidemia Father    Cancer Maternal Grandmother    Diabetes Maternal Grandmother    Heart disease Maternal Grandmother    Cancer Maternal Grandfather    Diabetes Maternal Grandfather    Heart disease Maternal Grandfather    Stroke Maternal Grandfather     Social History   Tobacco Use   Smoking status: Never   Smokeless tobacco: Never  Vaping Use   Vaping Use: Never used  Substance Use Topics   Alcohol use: No   Drug use: No    Home Medications Prior to Admission medications   Medication Sig Start Date End Date Taking? Authorizing Provider  ADDERALL XR 30 MG 24 hr capsule Take 30 mg by mouth daily as needed (for ADHD).  12/30/16   [provider]  ALPRAZolam Prudy Feeler) 0.5 MG tablet Take 0.5 mg by mouth 3 (three) times daily as needed for anxiety. 01/16/17   [provider]  Biotin 1 MG CAPS Take 1 capsule by mouth daily.    [provider]  buPROPion (WELLBUTRIN XL) 150 MG 24 hr  tablet Take 150 mg by mouth every morning.     [provider]  ibuprofen (ADVIL,MOTRIN) 200 MG tablet Take 600 mg by mouth every 6 (six) hours as needed for fever, headache, mild pain, moderate pain or cramping.     [provider]  Levonorgestrel-Ethinyl Estradiol (SEASONIQUE) 0.15-0.03 &0.01 MG tablet Take 1 tablet by mouth every morning.     [provider]  naproxen (NAPROSYN) 375 MG tablet Take 1 tablet (375 mg total) by mouth 2 (two) times daily. 07/15/18   Harris, Abigail, PA-C  ondansetron (ZOFRAN) 4 MG tablet Take 1 tablet (4 mg total) by mouth every 6 (six) hours. Patient not taking: Reported on 07/15/2018 11/28/17   Gilda Crease, MD  ondansetron (ZOFRAN) 4 MG tablet Take 1 tablet (4 mg total) by mouth 4 (four) times daily as needed for nausea or vomiting. 07/15/18   Arthor Captain, PA-C  oxyCODONE-acetaminophen (PERCOCET) 5-325 MG tablet Take 1-2 tablets by mouth every 4 (four) hours as needed for moderate pain or severe pain. Patient  not taking: Reported on 07/15/2018 11/28/17   Gilda Crease, MD  tamsulosin (FLOMAX) 0.4 MG CAPS capsule Take 1 capsule (0.4 mg total) by mouth daily. Patient not taking: Reported on 07/15/2018 11/28/17   Gilda Crease, MD    Allergies    Patient has no known allergies.  Review of Systems   Review of Systems  Constitutional:  Negative for chills and fever.  Cardiovascular:  Negative for chest pain.   Physical Exam Updated Vital Signs BP (!) 161/95 (BP Location: Left Arm)   Pulse 79   Temp 98.8 F (37.1 C) (Oral)   Resp 17   Ht 5\' 7"  (1.702 m)   Wt 64 kg   LMP  (LMP Unknown)   SpO2 100%   BMI 22.08 kg/m   Physical Exam Vitals and nursing note reviewed.  Constitutional:      General: She is not in acute distress.    Appearance: Normal appearance. She is well-developed. She is not ill-appearing or diaphoretic.  HENT:     Head: Normocephalic and atraumatic.  Eyes:     General:        Right eye: No discharge.        Left eye: No discharge.  Cardiovascular:     Rate and Rhythm: Normal rate.  Pulmonary:     Effort: Pulmonary effort is normal. No respiratory distress.  Abdominal:     General: There is no distension.  Musculoskeletal:        General: No deformity.     Cervical back: Neck supple.  Neurological:     Mental Status: She is alert and oriented to person, place, and time.     Coordination: Coordination normal.  Psychiatric:        Mood and Affect: Mood normal.        Behavior: Behavior normal.    ED Results / Procedures / Treatments   Labs (all labs ordered are listed, but only abnormal results are displayed) Labs Reviewed - No data to display  EKG None  Radiology No results found.  Procedures Procedures   Medications Ordered in ED Medications - No data to display  ED Course  I have reviewed the triage vital signs and the nursing notes.  Pertinent labs & imaging results that were available during my care of the patient were  reviewed by me and considered in my medical decision making (see chart for details).    MDM Rules/Calculators/A&P  Patient presents requesting medication refill, ran out of her Adderall, took last dose yesterday, called after-hours number for her doctor's office, they could not get a hold of the clinician and directed her to the emergency department.  She reports she typically has severe withdrawal symptoms if she misses this medication and is going into a holiday weekend, Dr. Stann Mainland not be back in the office until Tuesday.  Discussed with patient this is not something that is typically refilled in the emergency department.  We will give her a prescription just for 4 tablets until she can call her doctor's office again Tuesday morning, but stressed that this is not something that can be done routinely.  Patient expresses understanding as well as appreciation.  Discharged home in good condition.  Final Clinical Impression(s) / ED Diagnoses Final diagnoses:  Medication refill    Rx / DC Orders ED Discharge Orders          Ordered    ADDERALL XR 30 MG 24 hr capsule  Daily PRN        10/12/20 1355             Dartha Lodge, PA-C 10/12/20 1430    Dartha Lodge, [place] 10/12/20 1433    Derwood Kaplan, MD 10/14/20 1053

## 2020-10-12 NOTE — Discharge Instructions (Addendum)
Please follow up with your doctor to get refills of your chronic medications, this is not something that can be done routinely through the emergency department.

## 2020-10-12 NOTE — ED Triage Notes (Signed)
Patient reports she is here in need of adderall refill. Says she forgot to call it in two days ago and must have medication. Denies any pain or other symptoms in triage
